# Patient Record
Sex: Female | Born: 1935
Health system: Southern US, Community
[De-identification: ages and names within clinical notes are randomized; demographics above are authoritative.]

## PROBLEM LIST (undated history)

## (undated) DIAGNOSIS — C801 Malignant (primary) neoplasm, unspecified: Secondary | ICD-10-CM

## (undated) DIAGNOSIS — I639 Cerebral infarction, unspecified: Secondary | ICD-10-CM

## (undated) DIAGNOSIS — E785 Hyperlipidemia, unspecified: Secondary | ICD-10-CM

## (undated) HISTORY — DX: Hyperlipidemia, unspecified: E78.5

## (undated) HISTORY — DX: Malignant (primary) neoplasm, unspecified: C80.1

## (undated) HISTORY — DX: Cerebral infarction, unspecified: I63.9

---

## 1963-09-30 HISTORY — PX: APPENDECTOMY: SHX54

## 1963-09-30 HISTORY — PX: TUBAL LIGATION: SHX77

## 1972-09-29 HISTORY — PX: HERNIA REPAIR: SHX51

## 2000-09-29 HISTORY — PX: BREAST LUMPECTOMY: SHX2

## 2000-09-29 HISTORY — PX: BREAST BIOPSY: SHX20

## 2010-03-05 ENCOUNTER — Ambulatory Visit: Payer: Self-pay | Admitting: Ophthalmology

## 2010-03-19 ENCOUNTER — Ambulatory Visit: Payer: Self-pay | Admitting: Ophthalmology

## 2013-05-09 ENCOUNTER — Ambulatory Visit: Payer: Self-pay | Admitting: Family Medicine

## 2013-05-10 ENCOUNTER — Ambulatory Visit: Payer: Self-pay | Admitting: Family Medicine

## 2015-02-28 IMAGING — US US EXTREM LOW VENOUS*R*
1 series · 14 of 24 positions shown · non-contrast
Comparison: none

REASON FOR EXAM: Call Report  [id number]   RLE pain and swelling in leg
COMMENTS:

[Series 1: us extrem low venous*right* · 0.08mm/px · 30 acquisitions, 14 frames shown]
[im 1/30]
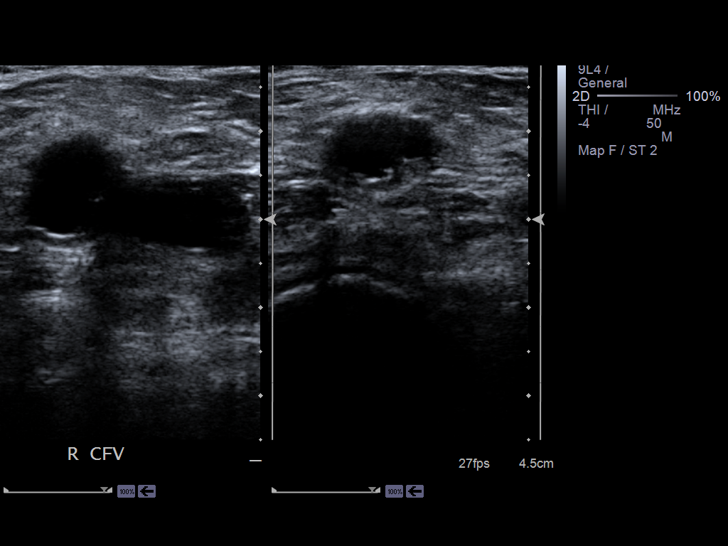
[im 3/30]
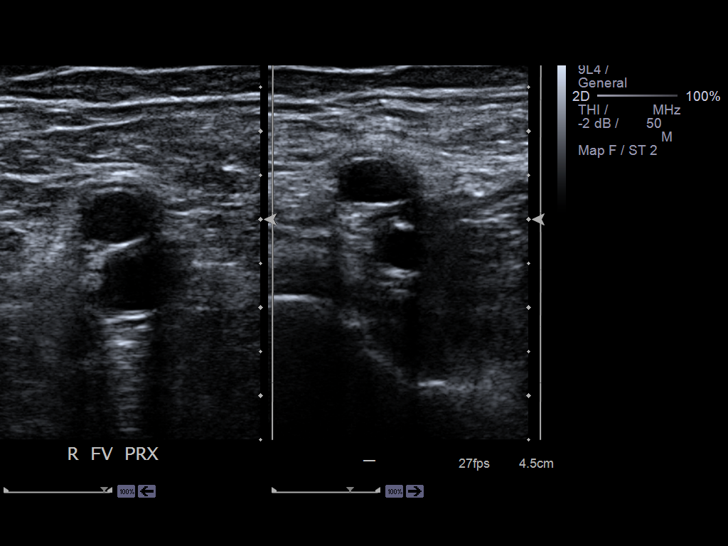
[im 6/30]
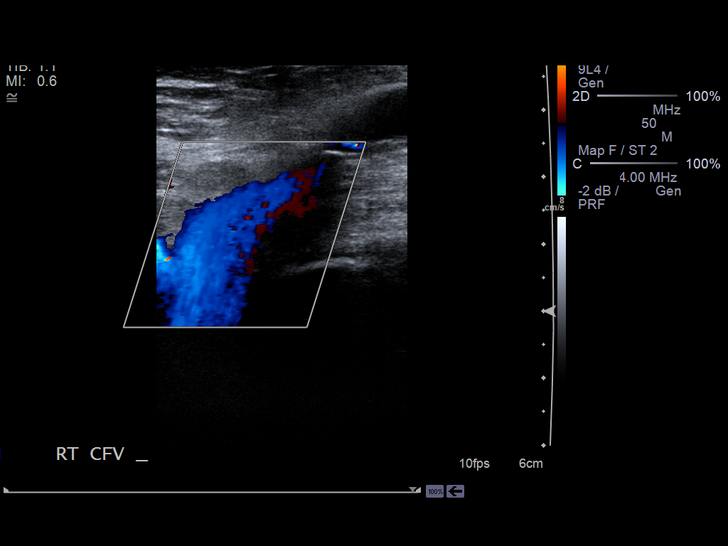
[im 8/30]
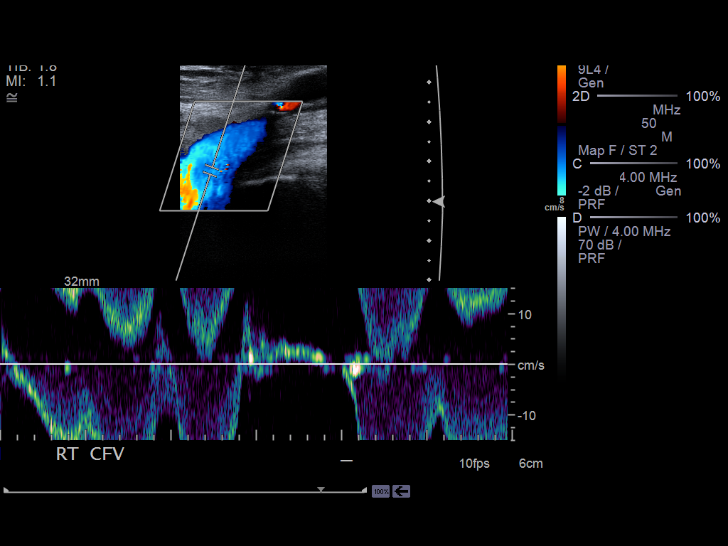
[im 9/30]
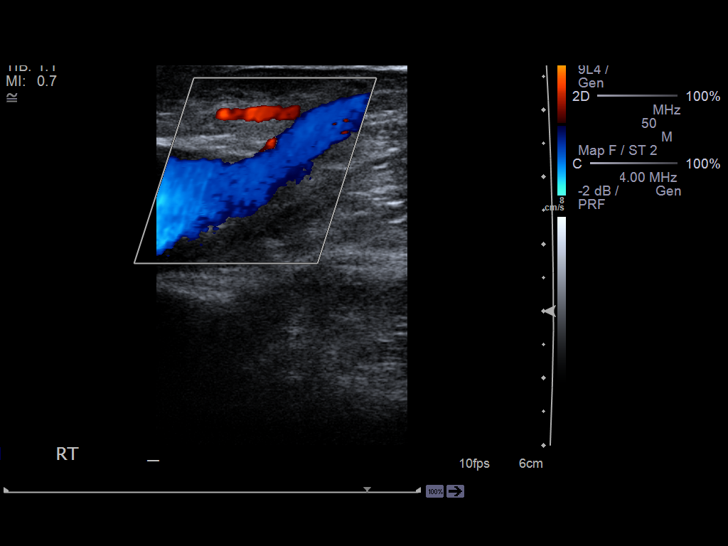
[im 12/30]
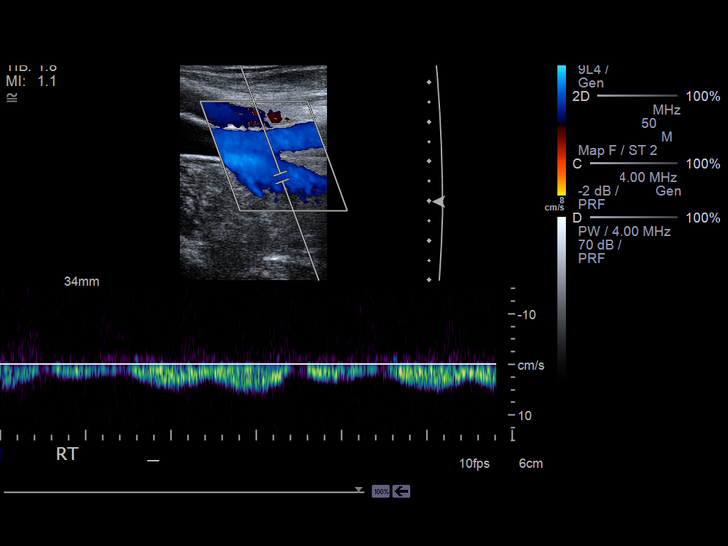
[im 14/30]
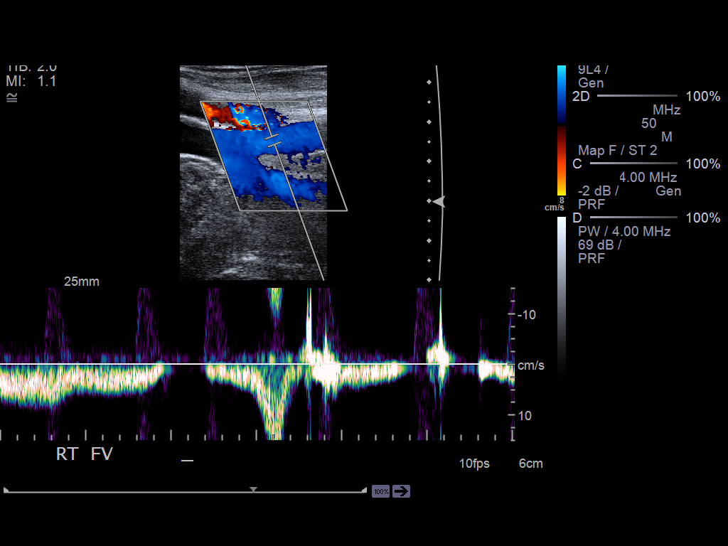
[im 17/30]
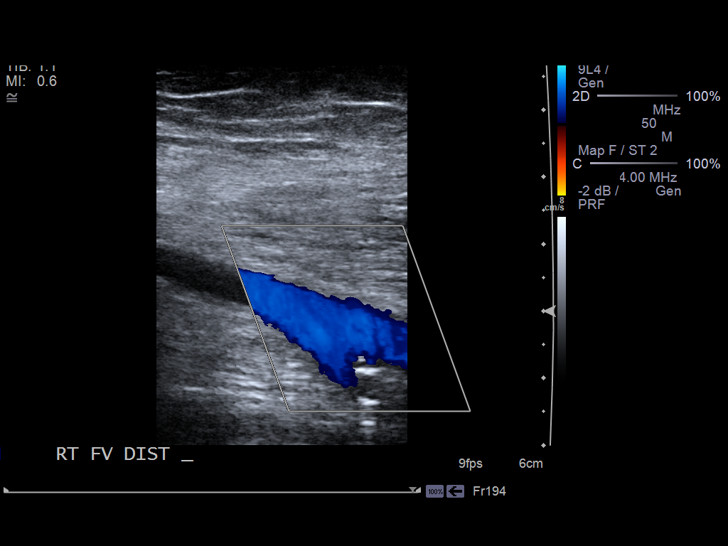
[im 19/30]
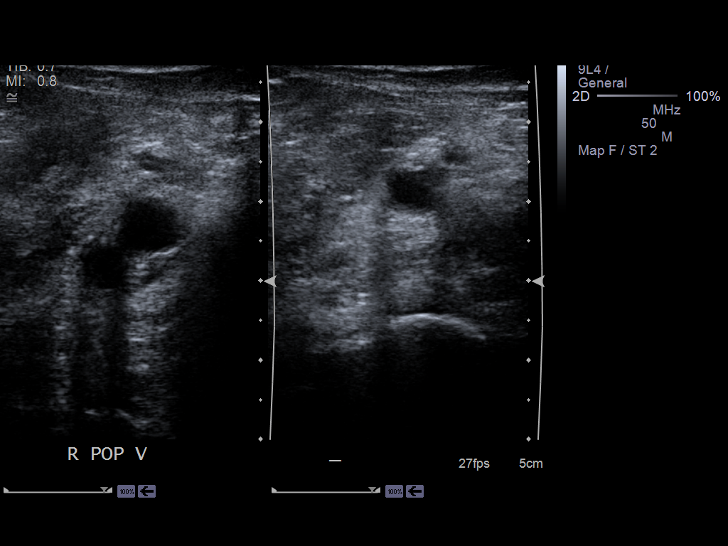
[im 22/30]
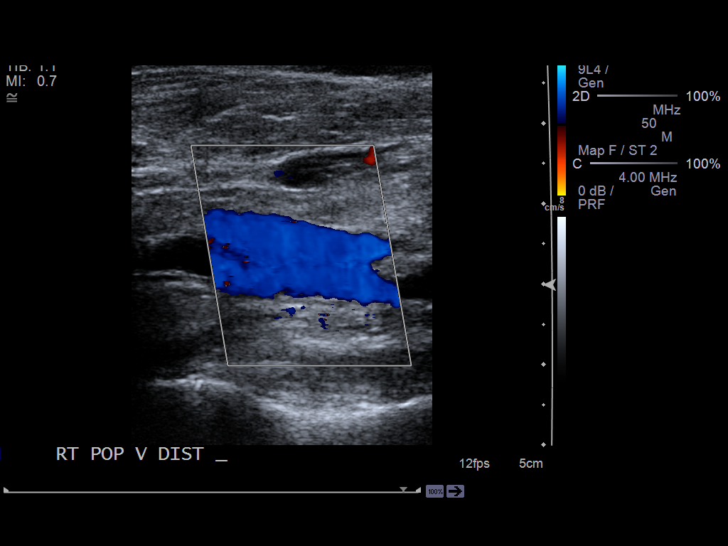
[im 24/30]
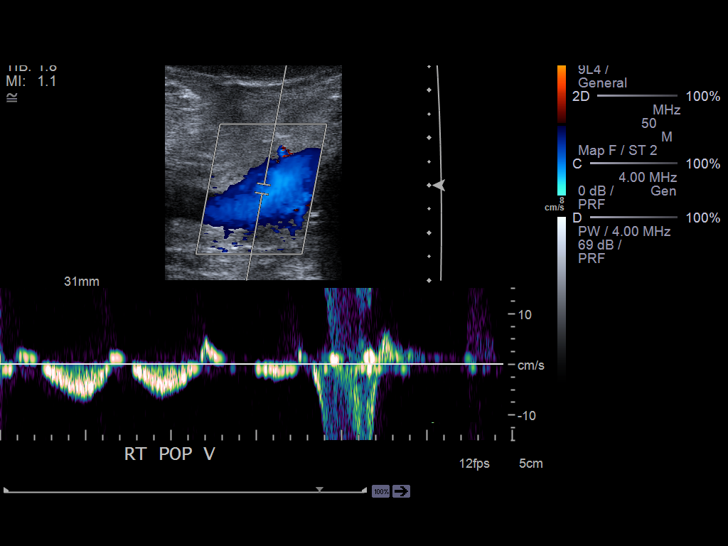
[im 26/30]
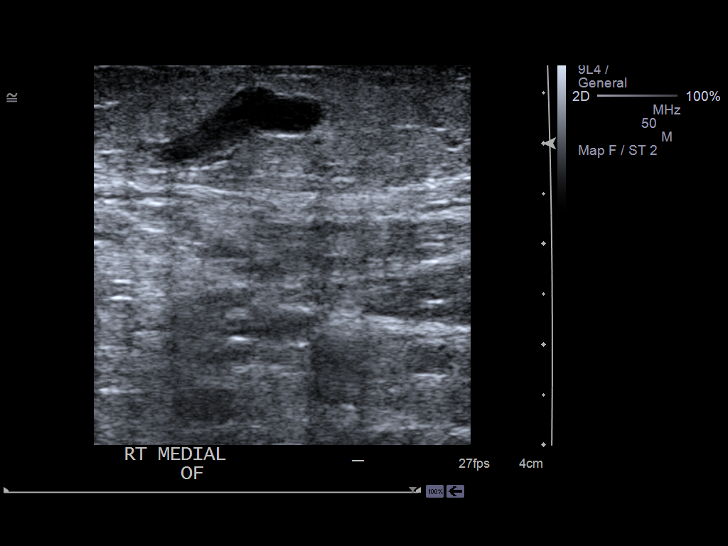
[im 28/30]
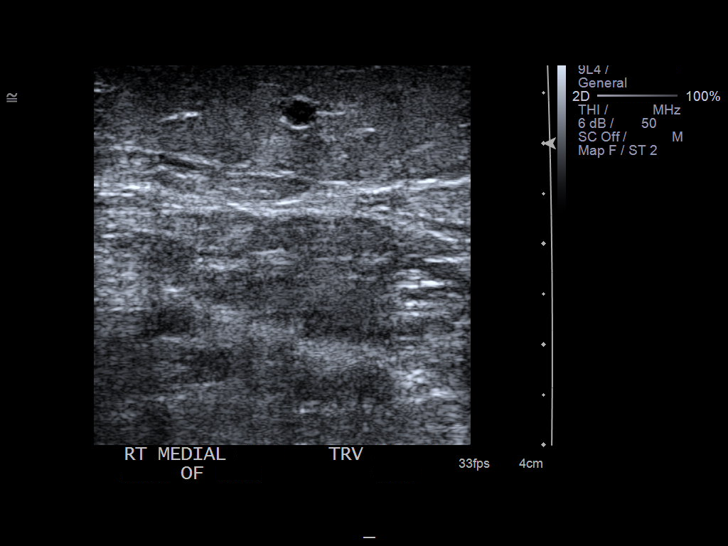
[im 30/30]
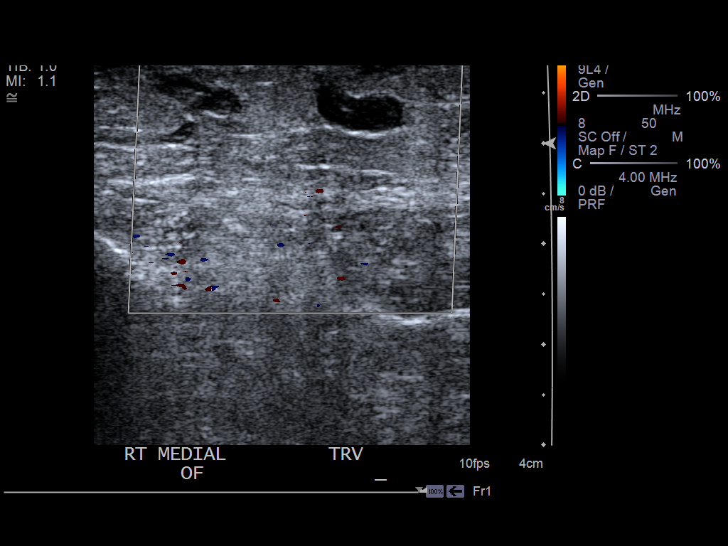

[14 of 24 positions shown; findings below may reference images not displayed]

PROCEDURE:     US  - US DOPPLER LOW EXTR RIGHT  - May 09, 2013  [DATE]

RESULT:     Doppler interrogation of the deep venous system of the right leg
is performed from the common femoral vein through the popliteal vein.
Grayscale images show complete compressibility. The color Doppler and
spectral Doppler appearance is normal. In the area of the medial aspect of
the right calf there is a superficial vein which is thrombosed in the area
of pain.
IMPRESSION: 1. Findings consistent with superficial venous thrombosis in the area of the
patient's symptoms in the medial portion of the catheter. No evidence of
right lower extremity deep vein thrombosis.

[REDACTED]

## 2015-08-06 ENCOUNTER — Ambulatory Visit (INDEPENDENT_AMBULATORY_CARE_PROVIDER_SITE_OTHER): Payer: Medicare PPO | Admitting: Family Medicine

## 2015-08-06 ENCOUNTER — Ambulatory Visit
Admission: RE | Admit: 2015-08-06 | Discharge: 2015-08-06 | Disposition: A | Discharge status: Home / Self Care | Payer: Medicare PPO | Source: Ambulatory Visit | Attending: Family Medicine | Admitting: Family Medicine

## 2015-08-06 ENCOUNTER — Encounter: Payer: Self-pay | Admitting: Family Medicine

## 2015-08-06 VITALS — BP 110/54 | HR 79 | Temp 97.9°F | Resp 16 | Wt 125.4 lb

## 2015-08-06 DIAGNOSIS — H6123 Impacted cerumen, bilateral: Secondary | ICD-10-CM | POA: Diagnosis not present

## 2015-08-06 DIAGNOSIS — M79671 Pain in right foot: Secondary | ICD-10-CM | POA: Diagnosis present

## 2015-08-06 DIAGNOSIS — R55 Syncope and collapse: Secondary | ICD-10-CM | POA: Insufficient documentation

## 2015-08-06 DIAGNOSIS — M79604 Pain in right leg: Secondary | ICD-10-CM

## 2015-08-06 DIAGNOSIS — O871 Deep phlebothrombosis in the puerperium: Secondary | ICD-10-CM | POA: Insufficient documentation

## 2015-08-06 DIAGNOSIS — H9190 Unspecified hearing loss, unspecified ear: Secondary | ICD-10-CM | POA: Insufficient documentation

## 2015-08-06 DIAGNOSIS — I80209 Phlebitis and thrombophlebitis of unspecified deep vessels of unspecified lower extremity: Secondary | ICD-10-CM | POA: Insufficient documentation

## 2015-08-06 DIAGNOSIS — M7989 Other specified soft tissue disorders: Secondary | ICD-10-CM | POA: Diagnosis not present

## 2015-08-06 MED ORDER — NAPROXEN 500 MG PO TABS: 500.0000 mg | ORAL_TABLET | Freq: Two times a day (BID) | ORAL | Status: DC

## 2015-08-06 MED ORDER — NEOMYCIN-POLYMYXIN-HC 3.5-10000-1 OT SOLN: 3.0000 [drp] | Freq: Three times a day (TID) | OTIC | Status: DC

## 2015-08-06 NOTE — Progress Notes (Signed)
Patient ID: Margaret Hall, female   DOB: 1925-02-21, 81 y.o.   MRN: [id number] Name: BERNARD DONAHOO   MRN: [id number]    DOB: May 31, 1925   Date:08/06/2015       Progress Note  Subjective  Chief Complaint  Chief Complaint  Patient presents with  . Foot Pain    right foot  . Toe Pain    right great toe    Foot Pain This is a new problem. The current episode started in the past 7 days (was working in the yard and thought she had stepped on a rock). The problem occurs constantly. The problem has been gradually worsening. Pertinent negatives include no chills or fever. Associated symptoms comments: Notice some redness and tenderness to touch.. The symptoms are aggravated by standing, walking and twisting. She has tried NSAIDs and heat for the symptoms. The treatment provided mild (gets better with Ibuprofen but returns when it wears off) relief.   Patient Active Problem List   Diagnosis Date Noted  . Difficulty hearing 08/06/2015  . Deep vein thrombosis (DVT), postpartum 08/06/2015  . Thrombophlebitis of deep veins of lower extremity ([place]) 08/06/2015  . Syncope and collapse 08/06/2015  . H/O malignant neoplasm of breast 12/03/2009   Past Surgical History  Procedure Laterality Date  . Appendectomy  1965  . Tubal ligation  1965  . Hernia repair  1974  . Breast biopsy Right 2002  . Breast lumpectomy Right 2002   Family History  Problem Relation Age of Onset  . Heart disease Sister   . Melanoma Brother   . Heart attack Brother    Social History  Substance Use Topics  . Smoking status: Never Smoker   . Smokeless tobacco: Never Used  . Alcohol Use: No    Current outpatient prescriptions:  .  BISMUTH SUBSALICYLATE PO, , Disp: , Rfl:  .  ibuprofen (ADVIL,MOTRIN) 200 MG tablet, IBUPROFEN, 200MG  (Oral Tablet)  PRN for headaches for 0 days  Quantity: 0.00;  Refills: 0   Ordered :18-Feb-2011  Doy Hutching ;  Started 03-Dec-2009 Active, Disp: , Rfl:  .  Multiple  Vitamins-Minerals (EYE VITAMINS PO), Take by mouth., Disp: , Rfl:   Allergies  Allergen Reactions  . Sulfa Antibiotics Nausea Only   Review of Systems  Constitutional: Negative.  Negative for fever and chills.  HENT: Negative.   Eyes: Negative.   Respiratory: Negative.   Cardiovascular: Negative.   Gastrointestinal: Negative.   Genitourinary: Negative.   Musculoskeletal: Positive for joint pain.  Skin: Negative.   Neurological: Negative.   Endo/Heme/Allergies: Negative.   Psychiatric/Behavioral: Negative.    Objective  Filed Vitals:   08/06/15 1344  BP: 110/54  Pulse: 79  Temp: 97.9 F (36.6 C)  TempSrc: Oral  Resp: 16  Weight: 125 lb 6.4 oz (56.881 kg)  SpO2: 95%   Physical Exam  Constitutional: She is oriented to person, place, and time and well-developed, well-nourished, and in no distress.  HENT:  Head: Normocephalic.  Bilateral cerumen impaction with diminished hearing.  Cardiovascular: Normal rate and regular rhythm.   Pulmonary/Chest: Effort normal and breath sounds normal.  Musculoskeletal: She exhibits tenderness.  Nodular joints in fingers of both hands. Red and very tender plantar surface of the right 1st MTP joint.  Neurological: She is alert and oriented to person, place, and time.  Psychiatric: Affect and judgment normal.   Assessment & Plan  1. Pain of right lower extremity Onset over the past week after moving some cinder blocks in  her yard. Pain in the right 1st MTP joint with redness. Suspect gout. Will get labs and x-ray evaluation. Treat with Naproxen and elevate when possible. Recheck pending lab and x-ray reports. - CBC with Differential/Platelet - COMPLETE METABOLIC PANEL WITH GFR - Uric acid - DG Foot Complete Right - Sedimentation rate - naproxen (NAPROSYN) 500 MG tablet; Take 1 tablet (500 mg total) by mouth 2 (two) times daily with a meal.  Dispense: 60 tablet; Refill: 0  2. Cerumen impaction, bilateral Both ears with wax impactions  and diminished hearing. Unable to irrigate canals clear of was. Will use Cortisporin Otic drops TID to help loosen cerumen and recheck in 4 days.

## 2015-08-06 NOTE — Patient Instructions (Signed)
Cerumen Impaction The structures of the external ear canal secrete a waxy substance known as cerumen. Excess cerumen can build up in the ear canal, causing a condition known as cerumen impaction. Cerumen impaction can cause ear pain and disrupt the function of the ear. The rate of cerumen production differs for each individual. In certain individuals, the configuration of the ear canal may decrease his or her ability to naturally remove cerumen. CAUSES Cerumen impaction is caused by excessive cerumen production or buildup. RISK FACTORS  Frequent use of swabs to clean ears.  Having narrow ear canals.  Having eczema.  Being dehydrated. SIGNS AND SYMPTOMS  Diminished hearing.  Ear drainage.  Ear pain.  Ear itch. TREATMENT Treatment may involve:  Over-the-counter or prescription ear drops to soften the cerumen.  Removal of cerumen by a health care provider. This may be done with:  Irrigation with warm water. This is the most common method of removal.  Ear curettes and other instruments.  Surgery. This may be done in severe cases. HOME CARE INSTRUCTIONS  Take medicines only as directed by your health care provider.  Do not insert objects into the ear with the intent of cleaning the ear. PREVENTION  Do not insert objects into the ear, even with the intent of cleaning the ear. Removing cerumen as a part of normal hygiene is not necessary, and the use of swabs in the ear canal is not recommended.  Drink enough water to keep your urine clear or pale yellow.  Control your eczema if you have it. SEEK MEDICAL CARE IF:  You develop ear pain.  You develop bleeding from the ear.  The cerumen does not clear after you use ear drops as directed.   This information is not intended to replace advice given to you by your health care provider. Make sure you discuss any questions you have with your health care provider.   Document Released: 10/23/2004 Document Revised: 10/06/2014  Document Reviewed: 05/02/2015 Elsevier Interactive Patient Education 2016 Elsevier Inc.  

## 2015-08-07 ENCOUNTER — Telehealth: Payer: Self-pay

## 2015-08-07 LAB — CBC WITH DIFFERENTIAL/PLATELET
BASOS: 0 %
Basophils Absolute: 0 10*3/uL (ref 0.0–0.2)
EOS (ABSOLUTE): 0.1 10*3/uL (ref 0.0–0.4)
EOS: 2 %
HEMATOCRIT: 39.3 % (ref 34.0–46.6)
Hemoglobin: 13 g/dL (ref 11.1–15.9)
IMMATURE GRANS (ABS): 0 10*3/uL (ref 0.0–0.1)
IMMATURE GRANULOCYTES: 0 %
LYMPHS: 27 %
Lymphocytes Absolute: 2.4 10*3/uL (ref 0.7–3.1)
MCH: 30.8 pg (ref 26.6–33.0)
MCHC: 33.1 g/dL (ref 31.5–35.7)
MCV: 93 fL (ref 79–97)
MONOCYTES: 9 %
Monocytes Absolute: 0.8 10*3/uL (ref 0.1–0.9)
NEUTROS ABS: 5.5 10*3/uL (ref 1.4–7.0)
NEUTROS PCT: 62 %
PLATELETS: 164 10*3/uL (ref 150–379)
RBC: 4.22 x10E6/uL (ref 3.77–5.28)
RDW: 13.8 % (ref 12.3–15.4)
WBC: 8.8 10*3/uL (ref 3.4–10.8)

## 2015-08-07 LAB — SEDIMENTATION RATE: Sed Rate: 2 mm/hr (ref 0–40)

## 2015-08-07 LAB — COMPREHENSIVE METABOLIC PANEL
ALBUMIN: 4.2 g/dL (ref 3.5–4.7)
ALT: 9 IU/L (ref 0–32)
AST: 19 IU/L (ref 0–40)
Albumin/Globulin Ratio: 1.9 (ref 1.1–2.5)
Alkaline Phosphatase: 71 IU/L (ref 39–117)
BUN / CREAT RATIO: 25 (ref 11–26)
BUN: 20 mg/dL (ref 8–27)
Bilirubin Total: 0.3 mg/dL (ref 0.0–1.2)
CALCIUM: 9.4 mg/dL (ref 8.7–10.3)
CO2: 25 mmol/L (ref 18–29)
Chloride: 103 mmol/L (ref 97–106)
Creatinine, Ser: 0.81 mg/dL (ref 0.57–1.00)
GFR, EST AFRICAN AMERICAN: 75 mL/min/{1.73_m2} (ref >59)
GFR, EST NON AFRICAN AMERICAN: 65 mL/min/{1.73_m2} (ref >59)
Globulin, Total: 2.2 g/dL (ref 1.5–4.5)
Glucose: 127 mg/dL — ABNORMAL HIGH (ref 65–99)
POTASSIUM: 4.5 mmol/L (ref 3.5–5.2)
Sodium: 145 mmol/L — ABNORMAL HIGH (ref 136–144)
TOTAL PROTEIN: 6.4 g/dL (ref 6.0–8.5)

## 2015-08-07 LAB — URIC ACID: Uric Acid: 5 mg/dL (ref 2.5–7.1)

## 2015-08-07 NOTE — Telephone Encounter (Signed)
Patient advised as directed below. Patient verbalized understanding.  

## 2015-08-07 NOTE — Telephone Encounter (Signed)
-----   Message from [place], [place] sent at 08/07/2015 12:06 PM EST ----- No sign of infection or gout in foot. X-ray shows some arthritis in toes. Should use Naproxen as prescribed and recheck ears as planned in 3 days.

## 2015-08-07 NOTE — Telephone Encounter (Signed)
Patient advised as directed below. Patient verbalized understanding and agrees with plan of care. Patient scheduled for a follow up appointment on 08/07/2015.

## 2015-08-07 NOTE — Telephone Encounter (Signed)
-----   Message from [place], [place] sent at 08/06/2015  5:25 PM EST ----- Some arthritis in toes and an unusual calcification along the plantar fascia of the bottom of the great toe joint. No sign of fracture. Awaiting final report of labs.

## 2015-08-10 ENCOUNTER — Encounter: Payer: Self-pay | Admitting: Family Medicine

## 2015-08-10 ENCOUNTER — Ambulatory Visit (INDEPENDENT_AMBULATORY_CARE_PROVIDER_SITE_OTHER): Payer: Medicare PPO | Admitting: Family Medicine

## 2015-08-10 VITALS — BP 124/60 | HR 80 | Temp 98.5°F | Resp 16 | Wt 127.0 lb

## 2015-08-10 DIAGNOSIS — H6123 Impacted cerumen, bilateral: Secondary | ICD-10-CM

## 2015-08-10 DIAGNOSIS — H6120 Impacted cerumen, unspecified ear: Secondary | ICD-10-CM | POA: Diagnosis not present

## 2015-08-10 DIAGNOSIS — H9202 Otalgia, left ear: Secondary | ICD-10-CM | POA: Diagnosis not present

## 2015-08-10 DIAGNOSIS — H918X9 Other specified hearing loss, unspecified ear: Secondary | ICD-10-CM

## 2015-08-10 NOTE — Progress Notes (Signed)
Patient ID: Margaret Hall, female   DOB: Nov 06, 1924, 81 y.o.   MRN: [id number]   Chief Complaint  Patient presents with  . Cerumen Impaction    Subjective:  HPI  Patient is here to follow up on decreased hearing and cerumen impaction. She was last seen 4 days ago and was advised to use Cortisporin drops for her ears. Patient has been using the drops. Patient states that hearing is not better in her left ear and that after the irrigation on her last office visit she developed a shooting pain and the shooting sensation resolved but ear still feels sore.  Prior to Admission medications   Medication Sig Start Date End Date Taking? Authorizing Provider  BISMUTH SUBSALICYLATE PO  [id number]  Yes Historical Provider, MD  Multiple Vitamins-Minerals (EYE VITAMINS PO) Take by mouth.   Yes Historical Provider, MD  naproxen (NAPROSYN) 500 MG tablet Take 1 tablet (500 mg total) by mouth 2 (two) times daily with a meal. 08/06/15  Yes Dennis E Chrismon, PA  neomycin-polymyxin-hydrocortisone (CORTISPORIN) otic solution Place 3 drops into both ears 3 (three) times daily. 08/06/15  Yes Margo Common, PA    Patient Active Problem List   Diagnosis Date Noted  . Difficulty hearing 08/06/2015  . Deep vein thrombosis (DVT), postpartum 08/06/2015  . Thrombophlebitis of deep veins of lower extremity ([place]) 08/06/2015  . Syncope and collapse 08/06/2015  . H/O malignant neoplasm of breast 12/03/2009    No past medical history on file.  Social History   Social History  . Marital Status: Widowed    Spouse Name: N/A  . Number of Children: N/A  . Years of Education: N/A   Occupational History  . Not on file.   Social History Main Topics  . Smoking status: Never Smoker   . Smokeless tobacco: Never Used  . Alcohol Use: No  . Drug Use: No  . Sexual Activity: No   Other Topics Concern  . Not on file   Social History Narrative    Allergies  Allergen Reactions  . Sulfa Antibiotics Nausea Only     Review of Systems  Constitutional: Negative.   HENT: Positive for ear pain.        Decreased hearing     There is no immunization history on file for this patient. Objective:  BP 124/60 mmHg  Pulse 80  Temp(Src) 98.5 F (36.9 C)  Resp 16  Wt 127 lb (57.607 kg)  Physical Exam  Constitutional: She is well-developed, well-nourished, and in no distress.  HENT:  Head: Normocephalic.  Left ear slightly tender to press on tragus. Both ear canals occluded with wax. No redness or drainage. Hearing very much diminished.  Eyes: Conjunctivae and EOM are normal.    Lab Results  Component Value Date   WBC 8.8 08/06/2015   HCT 39.3 08/06/2015   GLUCOSE 127* 08/06/2015    CMP     Component Value Date/Time   NA 145* 08/06/2015 1455   K 4.5 08/06/2015 1455   CL 103 08/06/2015 1455   CO2 25 08/06/2015 1455   GLUCOSE 127* 08/06/2015 1455   BUN 20 08/06/2015 1455   CREATININE 0.81 08/06/2015 1455   CALCIUM 9.4 08/06/2015 1455   PROT 6.4 08/06/2015 1455   ALBUMIN 4.2 08/06/2015 1455   AST 19 08/06/2015 1455   ALT 9 08/06/2015 1455   ALKPHOS 71 08/06/2015 1455   BILITOT 0.3 08/06/2015 1455   GFRNONAA 65 08/06/2015 1455   GFRAA 75 08/06/2015 1455  Assessment and Plan :   1. Bilateral hearing loss due to cerumen impaction Some further decrease in hearing since attempting irrigation 3 days ago. Still using Cortisporin Otic drops for discomfort and to help soften wax. With worsening hearing and discomfort, will refer to ENT. - Ambulatory referral to ENT  2. Left ear pain Unable to see TM due to bilateral cerumen impactions. With pain in left ear, will refer to ENT to rule out perforation. - Ambulatory referral to ENT   [facility] 08/10/2015 10:44 AM

## 2015-08-17 ENCOUNTER — Telehealth: Payer: Self-pay | Admitting: Family Medicine

## 2015-08-17 NOTE — Telephone Encounter (Signed)
Pt stated that she finished her neomycin-polymyxin-hydrocortisone (CORTISPORIN) otic solution today and wasn't sure if she needed to get them refilled. Pt stated she is going to the ENT on 08/28/15 and wasn't sure if she was supposed to use the drops until her visit. Pt stated if she needs to continue she would like the refill sent to [facility] in [place]. Thanks TNP

## 2015-08-18 NOTE — Telephone Encounter (Signed)
Shouldn't need more ear drops unless pain or drainage from the ears is occurring.

## 2015-08-18 NOTE — Telephone Encounter (Signed)
Please review-aa 

## 2015-08-20 NOTE — Telephone Encounter (Signed)
Patient advised as directed below. Patient verbalized understanding.  

## 2015-09-14 ENCOUNTER — Telehealth: Payer: Self-pay | Admitting: Family Medicine

## 2015-09-14 NOTE — Telephone Encounter (Signed)
Pt called saying she is getting an upper resp infection and wants to know if you will call her in a rx of amoxicillin.   She uses [facility] in [place]   Her call back is  [phone]  Thanks Con Memos

## 2015-09-14 NOTE — Telephone Encounter (Signed)
Contacted patient. Patient reports coughing and sputum production X 1 week. Patient denies any other symptoms. Please review.

## 2015-09-14 NOTE — Telephone Encounter (Signed)
Having some cough and congestion without fever over the past week. Only taking some liquid ibuprofen. Recommend Robitussin Cough and Cold or Mucinex-DM. Recheck in the office if no better over the weekend.

## 2016-01-18 ENCOUNTER — Ambulatory Visit (INDEPENDENT_AMBULATORY_CARE_PROVIDER_SITE_OTHER): Payer: Medicare PPO | Admitting: Family Medicine

## 2016-01-18 ENCOUNTER — Encounter: Payer: Self-pay | Admitting: Family Medicine

## 2016-01-18 VITALS — BP 102/54 | HR 87 | Temp 98.8°F | Resp 14 | Wt 124.2 lb

## 2016-01-18 DIAGNOSIS — J209 Acute bronchitis, unspecified: Secondary | ICD-10-CM

## 2016-01-18 DIAGNOSIS — J00 Acute nasopharyngitis [common cold]: Secondary | ICD-10-CM | POA: Diagnosis not present

## 2016-01-18 DIAGNOSIS — J069 Acute upper respiratory infection, unspecified: Secondary | ICD-10-CM

## 2016-01-18 MED ORDER — AZITHROMYCIN 250 MG PO TABS: ORAL_TABLET | ORAL | Status: DC

## 2016-01-18 NOTE — Patient Instructions (Signed)
Upper Respiratory Infection, Adult Most upper respiratory infections (URIs) are a viral infection of the air passages leading to the lungs. A URI affects the nose, throat, and upper air passages. The most common type of URI is nasopharyngitis and is typically referred to as "the common cold." URIs run their course and usually go away on their own. Most of the time, a URI does not require medical attention, but sometimes a bacterial infection in the upper airways can follow a viral infection. This is called a secondary infection. Sinus and middle ear infections are common types of secondary upper respiratory infections. Bacterial pneumonia can also complicate a URI. A URI can worsen asthma and chronic obstructive pulmonary disease (COPD). Sometimes, these complications can require emergency medical care and may be life threatening.  CAUSES Almost all URIs are caused by viruses. A virus is a type of germ and can spread from one person to another.  RISKS FACTORS You may be at risk for a URI if:   You smoke.   You have chronic heart or lung disease.  You have a weakened defense (immune) system.   You are very young or very old.   You have nasal allergies or asthma.  You work in crowded or poorly ventilated areas.  You work in health care facilities or schools. SIGNS AND SYMPTOMS  Symptoms typically develop 2-3 days after you come in contact with a cold virus. Most viral URIs last 7-10 days. However, viral URIs from the influenza virus (flu virus) can last 14-18 days and are typically more severe. Symptoms may include:   Runny or stuffy (congested) nose.   Sneezing.   Cough.   Sore throat.   Headache.   Fatigue.   Fever.   Loss of appetite.   Pain in your forehead, behind your eyes, and over your cheekbones (sinus pain).  Muscle aches.  DIAGNOSIS  Your health care provider may diagnose a URI by:  Physical exam.  Tests to check that your symptoms are not due to  another condition such as:  Strep throat.  Sinusitis.  Pneumonia.  Asthma. TREATMENT  A URI goes away on its own with time. It cannot be cured with medicines, but medicines may be prescribed or recommended to relieve symptoms. Medicines may help:  Reduce your fever.  Reduce your cough.  Relieve nasal congestion. HOME CARE INSTRUCTIONS   Take medicines only as directed by your health care provider.   Gargle warm saltwater or take cough drops to comfort your throat as directed by your health care provider.  Use a warm mist humidifier or inhale steam from a shower to increase air moisture. This may make it easier to breathe.  Drink enough fluid to keep your urine clear or pale yellow.   Eat soups and other clear broths and maintain good nutrition.   Rest as needed.   Return to work when your temperature has returned to normal or as your health care provider advises. You may need to stay home longer to avoid infecting others. You can also use a face mask and careful hand washing to prevent spread of the virus.  Increase the usage of your inhaler if you have asthma.   Do not use any tobacco products, including cigarettes, chewing tobacco, or electronic cigarettes. If you need help quitting, ask your health care provider. PREVENTION  The best way to protect yourself from getting a cold is to practice good hygiene.   Avoid oral or hand contact with people with cold   symptoms.   Wash your hands often if contact occurs.  There is no clear evidence that vitamin C, vitamin E, echinacea, or exercise reduces the chance of developing a cold. However, it is always recommended to get plenty of rest, exercise, and practice good nutrition.  SEEK MEDICAL CARE IF:   You are getting worse rather than better.   Your symptoms are not controlled by medicine.   You have chills.  You have worsening shortness of breath.  You have brown or red mucus.  You have yellow or brown nasal  discharge.  You have pain in your face, especially when you bend forward.  You have a fever.  You have swollen neck glands.  You have pain while swallowing.  You have white areas in the back of your throat. SEEK IMMEDIATE MEDICAL CARE IF:   You have severe or persistent:  Headache.  Ear pain.  Sinus pain.  Chest pain.  You have chronic lung disease and any of the following:  Wheezing.  Prolonged cough.  Coughing up blood.  A change in your usual mucus.  You have a stiff neck.  You have changes in your:  Vision.  Hearing.  Thinking.  Mood. MAKE SURE YOU:   Understand these instructions.  Will watch your condition.  Will get help right away if you are not doing well or get worse.   This information is not intended to replace advice given to you by your health care provider. Make sure you discuss any questions you have with your health care provider.   Document Released: 03/11/2001 Document Revised: 01/30/2015 Document Reviewed: 12/21/2013 Elsevier Interactive Patient Education 2016 Elsevier Inc.  

## 2016-01-18 NOTE — Progress Notes (Signed)
Patient ID: Margaret Hall, female   DOB: 04-13-1925, 82 y.o.   MRN: [id number]   Patient: Margaret Hall Female    DOB: January 25, 1925   82 y.o.   MRN: [id number] Visit Date: 01/18/2016  Today's Provider: [name], PA   Chief Complaint  Patient presents with  . URI   Subjective:    URI  This is a new problem. The current episode started in the past 7 days (Monday). The problem has been unchanged. There has been no fever. Associated symptoms include congestion, coughing, headaches, sneezing and a sore throat (scratchy). Pertinent negatives include no rhinorrhea. She has tried antihistamine (cough suppressant ) for the symptoms. The treatment provided no relief.    History reviewed. No pertinent past medical history. Patient Active Problem List   Diagnosis Date Noted  . Difficulty hearing 08/06/2015  . Deep vein thrombosis (DVT), postpartum 08/06/2015  . Thrombophlebitis of deep veins of lower extremity ([place]) 08/06/2015  . Syncope and collapse 08/06/2015  . H/O malignant neoplasm of breast 12/03/2009   Past Surgical History  Procedure Laterality Date  . Appendectomy  1965  . Tubal ligation  1965  . Hernia repair  1974  . Breast biopsy Right 2002  . Breast lumpectomy Right 2002   Family History  Problem Relation Age of Onset  . Heart disease Sister   . Melanoma Brother   . Heart attack Brother    Previous Medications   BISMUTH SUBSALICYLATE PO       MULTIPLE VITAMINS-MINERALS (EYE VITAMINS PO)    Take by mouth.   NAPROXEN (NAPROSYN) 500 MG TABLET    Take 1 tablet (500 mg total) by mouth 2 (two) times daily with a meal.   Allergies  Allergen Reactions  . Sulfa Antibiotics Nausea Only    Review of Systems  Constitutional: Negative.   HENT: Positive for congestion, sneezing and sore throat (scratchy). Negative for rhinorrhea.   Eyes: Negative.   Respiratory: Positive for cough.   Cardiovascular: Negative.   Gastrointestinal: Negative.   Endocrine: Negative.     Genitourinary: Negative.   Musculoskeletal: Negative.   Skin: Negative.   Allergic/Immunologic: Negative.   Neurological: Positive for headaches.  Hematological: Negative.   Psychiatric/Behavioral: Negative.     Social History  Substance Use Topics  . Smoking status: Never Smoker   . Smokeless tobacco: Never Used  . Alcohol Use: No   Objective:   BP 102/54 mmHg  Pulse 87  Temp(Src) 98.8 F (37.1 C) (Oral)  Resp 14  Wt 124 lb 3.2 oz (56.337 kg)  SpO2 95%  Physical Exam  Constitutional: She is oriented to person, place, and time. She appears well-developed and well-nourished.  HENT:  Head: Normocephalic.  Right Ear: External ear normal.  Left Ear: External ear normal.  Injected posterior pharynx. No exudates. Using hearing aid in the right ear. TM's with good reflex.  Eyes: Conjunctivae and EOM are normal.  Neck: Neck supple.  Cardiovascular: Normal rate, regular rhythm and normal heart sounds.   Pulmonary/Chest: Effort normal. She has no wheezes.  Coarse breath sounds with slight rhonchus.  Abdominal: Bowel sounds are normal.  Neurological: She is alert and oriented to person, place, and time.      Assessment & Plan:     1. Subacute bronchitis Onset with cough and some scratchy throat symptoms over the past 5 days. No fever but some rhonchi heard today. Will treat with antibiotic and may use Mucinex-DM. Increase fluid intake and recheck in 1 week  if needed. - azithromycin (ZITHROMAX) 250 MG tablet; Two tablets by mouth today then one daily for 4 days.  Dispense: 6 tablet; Refill: 0  2. Upper respiratory infection Having post nasal drip and scratchy throat. No fever but throat reddened without exudates. May gargle with warm saltwater and use antihistamine for any rhinorrhea or sneezing. Given antibiotic for early bronchitis. - azithromycin (ZITHROMAX) 250 MG tablet; Two tablets by mouth today then one daily for 4 days.  Dispense: 6 tablet; Refill: 0

## 2016-07-29 ENCOUNTER — Ambulatory Visit (INDEPENDENT_AMBULATORY_CARE_PROVIDER_SITE_OTHER): Payer: Medicare PPO | Admitting: Family Medicine

## 2016-07-29 ENCOUNTER — Encounter: Payer: Self-pay | Admitting: Family Medicine

## 2016-07-29 VITALS — BP 108/52 | Temp 98.2°F | Ht 61.0 in | Wt 125.4 lb

## 2016-07-29 DIAGNOSIS — Z23 Encounter for immunization: Secondary | ICD-10-CM

## 2016-07-29 DIAGNOSIS — H3530 Unspecified macular degeneration: Secondary | ICD-10-CM | POA: Diagnosis not present

## 2016-07-29 DIAGNOSIS — Z0000 Encounter for general adult medical examination without abnormal findings: Secondary | ICD-10-CM

## 2016-07-29 NOTE — Progress Notes (Signed)
Patient: Margaret Hall, Female    DOB: [birth year], 82 y.o.   MRN: [id number] Visit Date: 07/29/2016  Today's Provider: [name], PA   Chief Complaint  Patient presents with  . Medicare Wellness   Subjective:    Annual wellness visit LESSLI DEBATES is a 82 y.o. female who presents today for her Subsequent Annual Wellness Visit. She feels well. She reports exercising doing yard work and house work. She reports she is sleeping well.  -----------------------------------------------------------   Review of Systems  Constitutional: Negative.   HENT: Positive for hearing loss, rhinorrhea and sneezing.   Eyes: Positive for itching.  Respiratory: Negative.   Cardiovascular: Negative.   Gastrointestinal: Negative.   Endocrine: Negative.   Genitourinary: Negative.   Musculoskeletal: Positive for arthralgias.  Skin: Negative.   Allergic/Immunologic: Negative.   Neurological: Positive for headaches.  Hematological: Bruises/bleeds easily.  Psychiatric/Behavioral: Negative.     Social History   Social History  . Marital status: Widowed    Spouse name: N/A  . Number of children: N/A  . Years of education: N/A   Occupational History  . Not on file.   Social History Main Topics  . Smoking status: Never Smoker  . Smokeless tobacco: Never Used  . Alcohol use No  . Drug use: No  . Sexual activity: No   Other Topics Concern  . Not on file   Social History Narrative  . No narrative on file    Patient Active Problem List   Diagnosis Date Noted  . Difficulty hearing 08/06/2015  . Deep vein thrombosis (DVT), postpartum 08/06/2015  . Thrombophlebitis of deep veins of lower extremity ([place]) 08/06/2015  . Syncope and collapse 08/06/2015  . H/O malignant neoplasm of breast 12/03/2009    Past Surgical History:  Procedure Laterality Date  . APPENDECTOMY  1965  . BREAST BIOPSY Right 2002  . BREAST LUMPECTOMY Right 2002  . HERNIA REPAIR  1974  . [place]    Her family history includes Heart attack in her brother; Heart disease in her sister; Melanoma in her brother.    Previous Medications   BISMUTH SUBSALICYLATE PO       IBUPROFEN (ADVIL,MOTRIN) 200 MG TABLET    Take 200 mg by mouth as needed.   MULTIPLE VITAMINS-MINERALS (EYE VITAMINS PO)    Take by mouth.    Patient Care Team: Margo Common, PA as PCP - General (Family Medicine)     Objective:   Vitals: BP (!) 108/52 (BP Location: Right Arm, Patient Position: Sitting, Cuff Size: Normal)   Temp 98.2 F (36.8 C) (Oral)   Ht 5\' 1"  (1.549 m)   Wt 125 lb 6.4 oz (56.9 kg)   BMI 23.69 kg/m   Physical Exam  Constitutional: She is oriented to person, place, and time. She appears well-developed and well-nourished.  HENT:  Head: Normocephalic and atraumatic.  Right Ear: External ear normal.  Left Ear: External ear normal.  Nose: Nose normal.  Mouth/Throat: Oropharynx is clear and moist.  Eyes: Conjunctivae and EOM are normal. Pupils are equal, round, and reactive to light. Right eye exhibits no discharge.  Neck: Normal range of motion. Neck supple. No tracheal deviation present. No thyromegaly present.  Cardiovascular: Normal rate, regular rhythm, normal heart sounds and intact distal pulses.   No murmur heard. Pulmonary/Chest: Effort normal and breath sounds normal. No respiratory distress. She has no wheezes. She has no rales. She exhibits no tenderness.  Abdominal: Soft. She exhibits no distension  and no mass. There is no tenderness. There is no rebound and no guarding.  Musculoskeletal: Normal range of motion. She exhibits deformity. She exhibits no edema or tenderness.  Enlarged joints of fingers. Some stiffness in the mornings but fair grip strength.  Lymphadenopathy:    She has no cervical adenopathy.  Neurological: She is alert and oriented to person, place, and time. She has normal reflexes. No cranial nerve deficit. She exhibits normal muscle tone. Coordination  normal.  Skin: Skin is warm and dry. No rash noted. No erythema.  Psychiatric: She has a normal mood and affect. Her behavior is normal. Judgment and thought content normal.    Activities of Daily Living In your present state of health, do you have any difficulty performing the following activities: 07/29/2016  Hearing? Y  Vision? Y  Difficulty concentrating or making decisions? N  Walking or climbing stairs? N  Dressing or bathing? N  Doing errands, shopping? N  Some recent data might be hidden    Fall Risk Assessment Fall Risk  07/29/2016  Falls in the past year? No   Depression Screen PHQ 2/9 Scores 07/29/2016  PHQ - 2 Score 0   Cognitive Testing - 6-CIT  Correct? Score   What year is it? yes 0 0 or 4  What month is it? yes 0 0 or 3  Memorize:    Pia Mau,  44,  [place],      What time is it? (within 1 hour) yes 0 0 or 3  Count backwards from 20 yes 0 0, 2, or 4  Name the months of the year yes 0 0, 2, or 4  Repeat name & address above yes 3 0, 2, 4, 6, 8, or 10       TOTAL SCORE  3/28   Interpretation:  Normal  Normal (0-7) Abnormal (8-28)    Audit-C Alcohol Use Screening  Question Answer Points  How often do you have alcoholic drink? never 0  On days you do drink alcohol, how many drinks do you typically consume? 0 0  How oftey will you drink 6 or more in a total? never 0  Total Score:  0   A score of 3 or more in women, and 4 or more in men indicates increased risk for alcohol abuse, EXCEPT if all of the points are from question 1.     Assessment & Plan:     Annual Wellness Visit  Reviewed patient's Family Medical History Reviewed and updated list of patient's medical providers Assessment of cognitive impairment was done Assessed patient's functional ability Established a written schedule for health screening [place] Completed and Reviewed  Exercise Activities and Dietary recommendations Goals    Continue to  use yard work and house work as her exercise program.      There is no immunization history on file for this patient.  Health Maintenance  Topic Date Due  . TETANUS/TDAP  10/12/1945  . ZOSTAVAX  10/12/1986  . DEXA SCAN  10/13/1991  . PNA vac Low Risk Adult (1 of 2 - PCV13) 10/13/1991  . INFLUENZA VACCINE  04/29/2016      Discussed health benefits of physical activity, and encouraged her to engage in regular exercise appropriate for her age and condition.    ------------------------------------------------------------------------------------------------------------ 1. Medicare annual wellness visit, subsequent General health in great shape for her age. On no medications regularly. Uses hearing aid in the right ear only. Still driving short  distances to familiar destinations in the daylight only. Eating 3 meals a day. No GI upset (constipation, dysphagia, diarrhea or dyspepsia). Working in her yard and house for exercise. Goes to [employer] to [employer] and taxes for 3 hours 2 days a week. Lives alone. Given flu shot today. Will get routine labs in the next week or two.  2. Age-related macular degeneration Takes vitamins and will see ophthalmologist in a month.  3. Need for influenza vaccination - Flu vaccine HIGH DOSE PF

## 2016-08-07 ENCOUNTER — Other Ambulatory Visit: Payer: Self-pay

## 2016-08-07 DIAGNOSIS — Z0000 Encounter for general adult medical examination without abnormal findings: Secondary | ICD-10-CM

## 2016-08-08 ENCOUNTER — Telehealth: Payer: Self-pay

## 2016-08-08 LAB — CBC WITH DIFFERENTIAL/PLATELET
BASOS: 1 %
Basophils Absolute: 0 10*3/uL (ref 0.0–0.2)
EOS (ABSOLUTE): 0.2 10*3/uL (ref 0.0–0.4)
Eos: 4 %
Hematocrit: 39 % (ref 34.0–46.6)
Hemoglobin: 12.7 g/dL (ref 11.1–15.9)
IMMATURE GRANS (ABS): 0 10*3/uL (ref 0.0–0.1)
IMMATURE GRANULOCYTES: 0 %
LYMPHS: 46 %
Lymphocytes Absolute: 2.7 10*3/uL (ref 0.7–3.1)
MCH: 30.3 pg (ref 26.6–33.0)
MCHC: 32.6 g/dL (ref 31.5–35.7)
MCV: 93 fL (ref 79–97)
Monocytes Absolute: 0.6 10*3/uL (ref 0.1–0.9)
Monocytes: 10 %
NEUTROS PCT: 39 %
Neutrophils Absolute: 2.2 10*3/uL (ref 1.4–7.0)
PLATELETS: 155 10*3/uL (ref 150–379)
RBC: 4.19 x10E6/uL (ref 3.77–5.28)
RDW: 14.2 % (ref 12.3–15.4)
WBC: 5.8 10*3/uL (ref 3.4–10.8)

## 2016-08-08 LAB — COMPREHENSIVE METABOLIC PANEL
A/G RATIO: 1.7 (ref 1.2–2.2)
ALT: 8 IU/L (ref 0–32)
AST: 17 IU/L (ref 0–40)
Albumin: 4 g/dL (ref 3.5–4.7)
Alkaline Phosphatase: 79 IU/L (ref 39–117)
BUN/Creatinine Ratio: 18 (ref 12–28)
BUN: 16 mg/dL (ref 8–27)
Bilirubin Total: 0.8 mg/dL (ref 0.0–1.2)
CALCIUM: 9.3 mg/dL (ref 8.7–10.3)
CO2: 26 mmol/L (ref 18–29)
CREATININE: 0.87 mg/dL (ref 0.57–1.00)
Chloride: 102 mmol/L (ref 96–106)
GFR, EST AFRICAN AMERICAN: 68 mL/min/{1.73_m2} (ref >59)
GFR, EST NON AFRICAN AMERICAN: 59 mL/min/{1.73_m2} — AB (ref >59)
GLUCOSE: 94 mg/dL (ref 65–99)
Globulin, Total: 2.4 g/dL (ref 1.5–4.5)
Potassium: 4.4 mmol/L (ref 3.5–5.2)
Sodium: 144 mmol/L (ref 134–144)
TOTAL PROTEIN: 6.4 g/dL (ref 6.0–8.5)

## 2016-08-08 NOTE — Telephone Encounter (Signed)
-----   Message from Margo Common, [place] sent at 08/08/2016  8:03 AM EST ----- All blood tests normal. Recheck in 6 months.

## 2016-08-08 NOTE — Telephone Encounter (Signed)
Patient has been advised. KW 

## 2017-06-03 ENCOUNTER — Telehealth: Payer: Self-pay | Admitting: Family Medicine

## 2017-07-31 ENCOUNTER — Telehealth: Payer: Self-pay | Admitting: Family Medicine

## 2017-07-31 NOTE — Telephone Encounter (Signed)
Pt is scheduled for AWV on 08/04/17 with NHA and CPE with Simona Huh on 08/07/17. Thanks TNP

## 2017-08-04 ENCOUNTER — Ambulatory Visit (INDEPENDENT_AMBULATORY_CARE_PROVIDER_SITE_OTHER): Payer: Medicare PPO

## 2017-08-04 VITALS — BP 108/54 | HR 72 | Temp 97.9°F | Ht 61.0 in | Wt 122.8 lb

## 2017-08-04 DIAGNOSIS — Z0000 Encounter for general adult medical examination without abnormal findings: Secondary | ICD-10-CM

## 2017-08-04 DIAGNOSIS — Z23 Encounter for immunization: Secondary | ICD-10-CM | POA: Diagnosis not present

## 2017-08-04 NOTE — Progress Notes (Signed)
Subjective:   Margaret Hall is a 83 y.o. female who presents for Medicare Annual (Subsequent) preventive examination.  Review of Systems:  N/A  Cardiac Risk Factors include: advanced age (>47men, >27 women)     Objective:     Vitals: BP (!) 108/54 (BP Location: Left Arm)   Pulse 72   Temp 97.9 F (36.6 C) (Oral)   Ht 5\' 1"  (1.549 m)   Wt 122 lb 12.8 oz (55.7 kg)   BMI 23.20 kg/m   Body mass index is 23.2 kg/m.   Tobacco Social History   Tobacco Use  Smoking Status Never Smoker  Smokeless Tobacco Never Used     Counseling given: Not Answered   Past Medical History:  Diagnosis Date  . Cancer [facility])    skin cancer- removed 10/06/16   Past Surgical History:  Procedure Laterality Date  . APPENDECTOMY  1965  . BREAST BIOPSY Right 2002  . BREAST LUMPECTOMY Right 2002  . HERNIA REPAIR  1974  . TUBAL LIGATION  1965   Family History  Problem Relation Age of Onset  . Melanoma Brother   . Stroke Brother   . Heart attack Brother   . Heart disease Sister    Social History   Substance and Sexual Activity  Sexual Activity No    Outpatient Encounter Medications as of 08/04/2017  Medication Sig  . acetaminophen (TYLENOL) 500 MG tablet Take 500 mg every 6 (six) hours as needed by mouth.  Marland Kitchen BISMUTH SUBSALICYLATE PO as needed.   . Multiple Vitamins-Minerals (EYE VITAMINS PO) Take by mouth.  . [DISCONTINUED] ibuprofen (ADVIL,MOTRIN) 200 MG tablet Take 200 mg by mouth as needed.   No facility-administered encounter medications on file as of 08/04/2017.     Activities of Daily Living In your present state of health, do you have any difficulty performing the following activities: 08/04/2017  Hearing? Y  Comment wears a right ear hearing aid  Vision? Y  Comment has MD  Difficulty concentrating or making decisions? N  Walking or climbing stairs? N  Dressing or bathing? N  Doing errands, shopping? N  Preparing Food and eating ? N  Using the Toilet? N  In the  past six months, have you accidently leaked urine? N  Do you have problems with loss of bowel control? N  Managing your Medications? N  Managing your Finances? N  Housekeeping or managing your Housekeeping? N  Some recent data might be hidden    Patient Care Team: Chrismon, Vickki Muff, PA as PCP - General (Family Medicine) Dingeldein, Remo Lipps, MD as Consulting Physician (Ophthalmology)    Assessment:     Exercise Activities and Dietary recommendations Current Exercise Habits: The patient does not participate in regular exercise at present(gardens, does yardwork and house work), Exercise limited by: None identified  Goals    None     Fall Risk Fall Risk  08/04/2017 07/29/2016  Falls in the past year? No No   Depression Screen PHQ 2/9 Scores 08/04/2017 07/29/2016  PHQ - 2 Score 0 0     Cognitive Function: Pt declined screening today.        Immunization History  Administered Date(s) Administered  . Influenza, High Dose Seasonal PF 07/29/2016, 08/04/2017   Screening Tests Health Maintenance  Topic Date Due  . TETANUS/TDAP  10/12/1945  . DEXA SCAN  10/13/1991  . PNA vac Low Risk Adult (1 of 2 - PCV13) 10/13/1991  . INFLUENZA VACCINE  Completed  Plan:  I have personally reviewed and addressed the Medicare Annual Wellness questionnaire and have noted the following in the patient's chart:  A. Medical and social history B. Use of alcohol, tobacco or illicit drugs  C. Current medications and supplements D. Functional ability and status E.  Nutritional status F.  Physical activity G. Advance directives H. List of other physicians I.  Hospitalizations, surgeries, and ER visits in previous 12 months J.  [place] such as hearing and vision if needed, cognitive and depression L. Referrals and appointments - none  In addition, I have reviewed and discussed with patient certain preventive protocols, quality metrics, and best practice recommendations. A  written personalized care plan for preventive services as well as general preventive health recommendations were provided to patient.  See attached scanned questionnaire for additional information.   Signed,  Fabio Neighbors, LPN Nurse Health Advisor   MD Recommendations: None. Pt declined a DEXA referral, the pneumonia and tetanus vaccine today.  Reviewed Nurse Health Advisor's note and was available for consultation today. Agree with documentation and note.

## 2017-08-04 NOTE — Patient Instructions (Signed)
Margaret Hall , Thank you for taking time to come for your Medicare Wellness Visit. I appreciate your ongoing commitment to your health goals. Please review the following plan we discussed and let me know if I can assist you in the future.   Screening recommendations/referrals: Colonoscopy: no longer required Mammogram: no longer required Bone Density: declined Recommended yearly ophthalmology/optometry visit for glaucoma screening and checkup Recommended yearly dental visit for hygiene and checkup  Vaccinations: Influenza vaccine: completed Pneumococcal vaccine: declined Tdap vaccine: declined Shingles vaccine: declined  Advanced directives: Advance directive discussed with you today. Even though you declined this today please call our office should you change your mind and we can give you the proper paperwork for you to fill out.  Conditions/risks identified: Recommend increasing water intake to 4-6 glasses a day.   Next appointment: 08/07/17   Preventive Care 65 Years and Older, Female Preventive care refers to lifestyle choices and visits with your health care provider that can promote health and wellness. What does preventive care include?  A yearly physical exam. This is also called an annual well check.  Dental exams once or twice a year.  Routine eye exams. Ask your health care provider how often you should have your eyes checked.  Personal lifestyle choices, including:  Daily care of your teeth and gums.  Regular physical activity.  Eating a healthy diet.  Avoiding tobacco and drug use.  Limiting alcohol use.  Practicing safe sex.  Taking low-dose aspirin every day.  Taking vitamin and mineral supplements as recommended by your health care provider. What happens during an annual well check? The services and screenings done by your health care provider during your annual well check will depend on your age, overall health, lifestyle risk factors, and family  history of disease. Counseling  Your health care provider may ask you questions about your:  Alcohol use.  Tobacco use.  Drug use.  Emotional well-being.  Home and relationship well-being.  Sexual activity.  Eating habits.  History of falls.  Memory and ability to understand (cognition).  Work and work Statistician.  Reproductive health. Screening  You may have the following tests or measurements:  Height, weight, and BMI.  Blood pressure.  Lipid and cholesterol levels. These may be checked every 5 years, or more frequently if you are over 61 years old.  Skin check.  Lung cancer screening. You may have this screening every year starting at age 38 if you have a 30-pack-year history of smoking and currently smoke or have quit within the past 15 years.  Fecal occult blood test (FOBT) of the stool. You may have this test every year starting at age 68.  Flexible sigmoidoscopy or colonoscopy. You may have a sigmoidoscopy every 5 years or a colonoscopy every 10 years starting at age 43.  Hepatitis C blood test.  Hepatitis B blood test.  Sexually transmitted disease (STD) testing.  Diabetes screening. This is done by checking your blood sugar (glucose) after you have not eaten for a while (fasting). You may have this done every 1-3 years.  Bone density scan. This is done to screen for osteoporosis. You may have this done starting at age 68.  Mammogram. This may be done every 1-2 years. Talk to your health care provider about how often you should have regular mammograms. Talk with your health care provider about your test results, treatment options, and if necessary, the need for more tests. Vaccines  Your health care provider may recommend certain vaccines, such  as:  Influenza vaccine. This is recommended every year.  Tetanus, diphtheria, and acellular pertussis (Tdap, Td) vaccine. You may need a Td booster every 10 years.  Zoster vaccine. You may need this after  age 46.  Pneumococcal 13-valent conjugate (PCV13) vaccine. One dose is recommended after age 85.  Pneumococcal polysaccharide (PPSV23) vaccine. One dose is recommended after age 31. Talk to your health care provider about which screenings and vaccines you need and how often you need them. This information is not intended to replace advice given to you by your health care provider. Make sure you discuss any questions you have with your health care provider. Document Released: 10/12/2015 Document Revised: 06/04/2016 Document Reviewed: 07/17/2015 Elsevier Interactive Patient Education  2017 [place] Prevention in the Home Falls can cause injuries. They can happen to people of all ages. There are many things you can do to make your home safe and to help prevent falls. What can I do on the outside of my home?  Regularly fix the edges of walkways and driveways and fix any cracks.  Remove anything that might make you trip as you walk through a door, such as a raised step or threshold.  Trim any bushes or trees on the path to your home.  Use bright outdoor lighting.  Clear any walking paths of anything that might make someone trip, such as rocks or tools.  Regularly check to see if handrails are loose or broken. Make sure that both sides of any steps have handrails.  Any raised decks and porches should have guardrails on the edges.  Have any leaves, snow, or ice cleared regularly.  Use sand or salt on walking paths during winter.  Clean up any spills in your garage right away. This includes oil or grease spills. What can I do in the bathroom?  Use night lights.  Install grab bars by the toilet and in the tub and shower. Do not use towel bars as grab bars.  Use non-skid mats or decals in the tub or shower.  If you need to sit down in the shower, use a plastic, non-slip stool.  Keep the floor dry. Clean up any water that spills on the floor as soon as it  happens.  Remove soap buildup in the tub or shower regularly.  Attach bath mats securely with double-sided non-slip rug tape.  Do not have throw rugs and other things on the floor that can make you trip. What can I do in the bedroom?  Use night lights.  Make sure that you have a light by your bed that is easy to reach.  Do not use any sheets or blankets that are too big for your bed. They should not hang down onto the floor.  Have a firm chair that has side arms. You can use this for support while you get dressed.  Do not have throw rugs and other things on the floor that can make you trip. What can I do in the kitchen?  Clean up any spills right away.  Avoid walking on wet floors.  Keep items that you use a lot in easy-to-reach places.  If you need to reach something above you, use a strong step stool that has a grab bar.  Keep electrical cords out of the way.  Do not use floor polish or wax that makes floors slippery. If you must use wax, use non-skid floor wax.  Do not have throw rugs and other things on the  floor that can make you trip. What can I do with my stairs?  Do not leave any items on the stairs.  Make sure that there are handrails on both sides of the stairs and use them. Fix handrails that are broken or loose. Make sure that handrails are as long as the stairways.  Check any carpeting to make sure that it is firmly attached to the stairs. Fix any carpet that is loose or worn.  Avoid having throw rugs at the top or bottom of the stairs. If you do have throw rugs, attach them to the floor with carpet tape.  Make sure that you have a light switch at the top of the stairs and the bottom of the stairs. If you do not have them, ask someone to add them for you. What else can I do to help prevent falls?  Wear shoes that:  Do not have high heels.  Have rubber bottoms.  Are comfortable and fit you well.  Are closed at the toe. Do not wear sandals.  If you  use a stepladder:  Make sure that it is fully opened. Do not climb a closed stepladder.  Make sure that both sides of the stepladder are locked into place.  Ask someone to hold it for you, if possible.  Clearly mark and make sure that you can see:  Any grab bars or handrails.  First and last steps.  Where the edge of each step is.  Use tools that help you move around (mobility aids) if they are needed. These include:  Canes.  Walkers.  Scooters.  Crutches.  Turn on the lights when you go into a dark area. Replace any light bulbs as soon as they burn out.  Set up your furniture so you have a clear path. Avoid moving your furniture around.  If any of your floors are uneven, fix them.  If there are any pets around you, be aware of where they are.  Review your medicines with your doctor. Some medicines can make you feel dizzy. This can increase your chance of falling. Ask your doctor what other things that you can do to help prevent falls. This information is not intended to replace advice given to you by your health care provider. Make sure you discuss any questions you have with your health care provider. Document Released: 07/12/2009 Document Revised: 02/21/2016 Document Reviewed: 10/20/2014 Elsevier Interactive Patient Education  2017 [employer].

## 2017-08-07 ENCOUNTER — Encounter: Payer: Self-pay | Admitting: Family Medicine

## 2017-08-07 ENCOUNTER — Ambulatory Visit (INDEPENDENT_AMBULATORY_CARE_PROVIDER_SITE_OTHER): Payer: Medicare PPO | Admitting: Family Medicine

## 2017-08-07 VITALS — BP 92/62 | HR 60 | Temp 97.5°F | Ht 61.0 in | Wt 119.8 lb

## 2017-08-07 DIAGNOSIS — H6123 Impacted cerumen, bilateral: Secondary | ICD-10-CM

## 2017-08-07 DIAGNOSIS — Z0000 Encounter for general adult medical examination without abnormal findings: Secondary | ICD-10-CM

## 2017-08-07 DIAGNOSIS — H3530 Unspecified macular degeneration: Secondary | ICD-10-CM | POA: Diagnosis not present

## 2017-08-07 LAB — CBC WITH DIFFERENTIAL/PLATELET
BASOS ABS: 40 {cells}/uL (ref 0–200)
Basophils Relative: 0.7 %
Eosinophils Absolute: 80 cells/uL (ref 15–500)
Eosinophils Relative: 1.4 %
HCT: 38.3 % (ref 35.0–45.0)
Hemoglobin: 13.1 g/dL (ref 11.7–15.5)
LYMPHS ABS: 2451 {cells}/uL (ref 850–3900)
MCH: 31.3 pg (ref 27.0–33.0)
MCHC: 34.2 g/dL (ref 32.0–36.0)
MCV: 91.6 fL (ref 80.0–100.0)
MONOS PCT: 8.3 %
MPV: 11.4 fL (ref 7.5–12.5)
NEUTROS PCT: 46.6 %
Neutro Abs: 2656 cells/uL (ref 1500–7800)
PLATELETS: 139 10*3/uL — AB (ref 140–400)
RBC: 4.18 10*6/uL (ref 3.80–5.10)
RDW: 12.4 % (ref 11.0–15.0)
TOTAL LYMPHOCYTE: 43 %
WBC mixed population: 473 cells/uL (ref 200–950)
WBC: 5.7 10*3/uL (ref 3.8–10.8)

## 2017-08-07 LAB — COMPLETE METABOLIC PANEL WITH GFR
AG Ratio: 1.5 (calc) (ref 1.0–2.5)
ALKALINE PHOSPHATASE (APISO): 58 U/L (ref 33–130)
ALT: 7 U/L (ref 6–29)
AST: 17 U/L (ref 10–35)
Albumin: 3.9 g/dL (ref 3.6–5.1)
BUN / CREAT RATIO: 17 (calc) (ref 6–22)
BUN: 16 mg/dL (ref 7–25)
CALCIUM: 9.1 mg/dL (ref 8.6–10.4)
CHLORIDE: 105 mmol/L (ref 98–110)
CO2: 28 mmol/L (ref 20–32)
Creat: 0.93 mg/dL — ABNORMAL HIGH (ref 0.60–0.88)
GFR, EST AFRICAN AMERICAN: 63 mL/min/{1.73_m2} (ref >=60)
GFR, Est Non African American: 54 mL/min/{1.73_m2} — ABNORMAL LOW (ref >=60)
GLOBULIN: 2.6 g/dL (ref 1.9–3.7)
GLUCOSE: 91 mg/dL (ref 65–99)
Potassium: 4.1 mmol/L (ref 3.5–5.3)
SODIUM: 142 mmol/L (ref 135–146)
Total Bilirubin: 0.9 mg/dL (ref 0.2–1.2)
Total Protein: 6.5 g/dL (ref 6.1–8.1)

## 2017-08-07 NOTE — Progress Notes (Signed)
Patient: Margaret Hall, Female    DOB: September 26, 1925, 83 y.o.   MRN: [id number] Visit Date: 08/07/2017  Today's Provider: [name], PA   Chief Complaint  Patient presents with  . Annual Exam   Subjective:    Annual physical exam Margaret Hall is a 83 y.o. female who presents today for health maintenance and complete physical. She feels well. She reports exercising doing yard work and house work. She reports she is sleeping well. She had a AWE on 08/04/2017. Patient declined bone density, pneumonia and tetanus vaccines. -----------------------------------------------------------------   Review of Systems  Constitutional: Negative.   HENT: Negative.   Eyes: Negative.   Respiratory: Negative.   Cardiovascular: Negative.   Gastrointestinal: Negative.   Endocrine: Negative.   Genitourinary: Negative.   Musculoskeletal: Negative.   Skin: Negative.   Allergic/Immunologic: Negative.   Neurological: Positive for dizziness (this morning when getting out of bed).  Hematological: Negative.   Psychiatric/Behavioral: Negative.     Social History      She  reports that  has never smoked. she has never used smokeless tobacco. She reports that she does not drink alcohol or use drugs.       Social History   Socioeconomic History  . Marital status: Widowed    Spouse name: None  . Number of children: None  . Years of education: None  . Highest education level: None  Social Needs  . Financial resource strain: None  . Food insecurity - worry: None  . Food insecurity - inability: None  . Transportation needs - medical: None  . Transportation needs - non-medical: None  Occupational History  . None  Tobacco Use  . Smoking status: Never Smoker  . Smokeless tobacco: Never Used  Substance and Sexual Activity  . Alcohol use: No    Alcohol/week: 0.0 oz  . Drug use: No  . Sexual activity: No  Other Topics Concern  . None  Social History Narrative  . None    Past  Medical History:  Diagnosis Date  . Cancer [facility])    skin cancer- removed 10/06/16     Patient Active Problem List   Diagnosis Date Noted  . Age-related macular degeneration 07/29/2016  . Difficulty hearing 08/06/2015  . Deep vein thrombosis (DVT), postpartum 08/06/2015  . Thrombophlebitis of deep veins of lower extremity ([place]) 08/06/2015  . Syncope and collapse 08/06/2015  . H/O malignant neoplasm of breast 12/03/2009    Past Surgical History:  Procedure Laterality Date  . APPENDECTOMY  1965  . BREAST BIOPSY Right 2002  . BREAST LUMPECTOMY Right 2002  . HERNIA REPAIR  1974  . [place]    Family History        Family Status  Relation Name Status  . Mother  Deceased at age 56       intestinal blockage  . Father  Deceased at age 29       cerebral hemorrhage  . Brother 1 Deceased  . Brother 2 Deceased at age 68  . Sister  (Not Specified)        Her family history includes Heart attack in her brother; Heart disease in her sister; Melanoma in her brother; Stroke in her brother.     Allergies  Allergen Reactions  . Sulfa Antibiotics Nausea Only  . Sulfasalazine Nausea Only     Current Outpatient Medications:  .  acetaminophen (TYLENOL) 500 MG tablet, Take 500 mg every 6 (six) hours as needed  by mouth., Disp: , Rfl:  .  BISMUTH SUBSALICYLATE PO, as needed. , Disp: , Rfl:  .  Multiple Vitamins-Minerals (EYE VITAMINS PO), Take by mouth., Disp: , Rfl:    Patient Care Team: Anzel Kearse, Vickki Muff, PA as PCP - General (Family Medicine) Dingeldein, Remo Lipps, MD as Consulting Physician (Ophthalmology)      Objective:   Vitals: BP 92/62 (BP Location: Right Arm, Patient Position: Sitting, Cuff Size: Normal)   Pulse 60   Temp (!) 97.5 F (36.4 C) (Oral)   Ht 5\' 1"  (1.549 m)   Wt 119 lb 12.8 oz (54.3 kg)   SpO2 99%   BMI 22.64 kg/m    Physical Exam  Constitutional: She is oriented to person, place, and time. She appears well-developed and well-nourished.    HENT:  Head: Normocephalic.  Nose: Nose normal.  Mouth/Throat: Oropharynx is clear and moist.  Diminished hearing. Wears a hearing aid in the right ear only. Cerumen occluding both canals.  Eyes: Conjunctivae and EOM are normal.  Neck: Neck supple. No thyromegaly present.  Cardiovascular: Normal rate and regular rhythm.  Pulmonary/Chest: Effort normal and breath sounds normal.  Abdominal: Soft. Bowel sounds are normal.  Genitourinary:  Genitourinary Comments: Deferred for her age.  Musculoskeletal: Normal range of motion.  Arthritic enlargement of several IP joints of fingers. Good grip strength.  Lymphadenopathy:    She has no cervical adenopathy.  Neurological: She is alert and oriented to person, place, and time. She has normal reflexes.  Skin: Skin is warm and dry.  Psychiatric: She has a normal mood and affect. Her behavior is normal. Thought content normal.   Depression Screen PHQ 2/9 Scores 08/04/2017 07/29/2016  PHQ - 2 Score 0 0    Assessment & Plan:     Routine Health Maintenance and Physical Exam  Exercise Activities and Dietary recommendations Goals    Continue working in her yard - mowing, tilling and gardening.      Immunization History  Administered Date(s) Administered  . Influenza, High Dose Seasonal PF 07/29/2016, 08/04/2017    Health Maintenance  Topic Date Due  . TETANUS/TDAP  10/12/1945  . DEXA SCAN  10/13/1991  . PNA vac Low Risk Adult (1 of 2 - PCV13) 10/13/1991  . INFLUENZA VACCINE  Completed     Discussed health benefits of physical activity, and encouraged her to engage in regular exercise appropriate for her age and condition.    -------------------------------------------------------------------- 1. Annual physical exam Good general heath for advanced age. Refuses immunizations and BMD. Continues to live alone, working at an [employer] (42 years) and doing all her own house or yard work. Will get routine labs and follow up  pending reports.  2. Age-related macular degeneration Stable and followed by ophthalmologist. Still has good enough vision to drive for herself.  3. Bilateral hearing loss due to cerumen impaction Wearing a hearing aid in the right ear. Wants irrigation today to remove cerumen occluding each ear canal.    [name], PA  [facility]

## 2017-09-16 NOTE — Telephone Encounter (Signed)
Visit complete.

## 2017-11-11 ENCOUNTER — Encounter: Payer: Self-pay | Admitting: Physician Assistant

## 2017-11-11 ENCOUNTER — Ambulatory Visit (INDEPENDENT_AMBULATORY_CARE_PROVIDER_SITE_OTHER): Payer: Medicare PPO | Admitting: Physician Assistant

## 2017-11-11 VITALS — BP 120/70 | HR 84 | Temp 98.6°F | Resp 16 | Wt 118.2 lb

## 2017-11-11 DIAGNOSIS — R059 Cough, unspecified: Secondary | ICD-10-CM

## 2017-11-11 DIAGNOSIS — R0602 Shortness of breath: Secondary | ICD-10-CM

## 2017-11-11 DIAGNOSIS — R05 Cough: Secondary | ICD-10-CM | POA: Diagnosis not present

## 2017-11-11 DIAGNOSIS — J441 Chronic obstructive pulmonary disease with (acute) exacerbation: Secondary | ICD-10-CM

## 2017-11-11 MED ORDER — BENZONATATE 100 MG PO CAPS: 100.0000 mg | ORAL_CAPSULE | Freq: Two times a day (BID) | ORAL | 0 refills | Status: DC | PRN

## 2017-11-11 MED ORDER — LEVOFLOXACIN 250 MG PO TABS: 250.0000 mg | ORAL_TABLET | Freq: Every day | ORAL | 0 refills | Status: DC

## 2017-11-11 NOTE — Patient Instructions (Signed)

## 2017-11-11 NOTE — Progress Notes (Addendum)
Patient: Margaret Hall Female    DOB: 01-24-1925   84 y.o.   MRN: [id number] Visit Date: 11/11/2017  Today's Provider: Mar Daring, PA-C   Chief Complaint  Patient presents with  . URI   Subjective:    URI   This is a new problem. The current episode started in the past 7 days (sunday night). The problem has been gradually worsening. There has been no fever. Associated symptoms include congestion (in her chest), coughing, ear pain (aching,bilateral), headaches and sneezing. Pertinent negatives include no chest pain, diarrhea, rhinorrhea, vomiting or wheezing. Sore throat: scrathy. She has tried decongestant, acetaminophen and increased fluids (cough syrup;gargles with salt) for the symptoms.      Allergies  Allergen Reactions  . Sulfa Antibiotics Nausea Only  . Sulfasalazine Nausea Only     Current Outpatient Medications:  .  acetaminophen (TYLENOL) 500 MG tablet, Take 500 mg every 6 (six) hours as needed by mouth., Disp: , Rfl:  .  BISMUTH SUBSALICYLATE PO, as needed. , Disp: , Rfl:  .  Multiple Vitamins-Minerals (EYE VITAMINS PO), Take by mouth., Disp: , Rfl:   Review of Systems  Constitutional: Positive for chills. Negative for fatigue.  HENT: Positive for congestion (in her chest), ear pain (aching,bilateral), postnasal drip, sinus pressure and sneezing. Negative for rhinorrhea and trouble swallowing. Sore throat: scrathy.   Eyes:       Watery eyes  Respiratory: Positive for cough. Negative for chest tightness, shortness of breath and wheezing.   Cardiovascular: Negative for chest pain, palpitations and leg swelling.  Gastrointestinal: Negative for diarrhea and vomiting.  Neurological: Positive for headaches. Negative for dizziness and numbness.    Social History   Tobacco Use  . Smoking status: Never Smoker  . Smokeless tobacco: Never Used  Substance Use Topics  . Alcohol use: No    Alcohol/week: 0.0 oz   Objective:   BP 120/70 (BP Location:  Left Arm, Patient Position: Sitting, Cuff Size: Normal)   Pulse 84   Temp 98.6 F (37 C) (Oral)   Resp 16   Wt 118 lb 3.2 oz (53.6 kg)   SpO2 96%   BMI 22.33 kg/m    Physical Exam  Constitutional: She appears well-developed and well-nourished. No distress.  HENT:  Head: Normocephalic and atraumatic.  Right Ear: Hearing, tympanic membrane, external ear and ear canal normal.  Left Ear: Hearing, tympanic membrane, external ear and ear canal normal.  Nose: Nose normal.  Mouth/Throat: Uvula is midline, oropharynx is clear and moist and mucous membranes are normal. No oropharyngeal exudate.  Eyes: Conjunctivae are normal. Pupils are equal, round, and reactive to light. Right eye exhibits no discharge. Left eye exhibits no discharge. No scleral icterus.  Neck: Normal range of motion. Neck supple. No tracheal deviation present. No thyromegaly present.  Cardiovascular: Normal rate, regular rhythm and normal heart sounds. Exam reveals no gallop and no friction rub.  No murmur heard. Pulmonary/Chest: Effort normal. No stridor. No respiratory distress. She has decreased breath sounds. She has no wheezes. She has no rhonchi. She has no rales.  Lymphadenopathy:    She has no cervical adenopathy.  Skin: Skin is warm and dry. She is not diaphoretic.  Vitals reviewed.       Assessment & Plan:     1. SOB (shortness of breath) Will get CXR due to patient's decreased breath sounds, significant cough and decreased O2 sat noted today in the office. Will start levaquin empirically. Tessalon  perles sent for cough. Push fluids. Call if symptoms worsen or fail to improve.  - DG Chest 2 View; Future  2. Cough See above medical treatment plan. - DG Chest 2 View; Future - benzonatate (TESSALON) 100 MG capsule; Take 1 capsule (100 mg total) by mouth 2 (two) times daily as needed for cough.  Dispense: 20 capsule; Refill: 0  3. Chronic obstructive pulmonary disease with acute exacerbation ([place]) See above  medical treatment plan. CXR clear for pneumonia but noted COPD changes. - levofloxacin (LEVAQUIN) 250 MG tablet; Take 1 tablet (250 mg total) by mouth daily.  Dispense: 7 tablet; Refill: 0       Mar Daring, PA-C  [facility]

## 2017-11-12 ENCOUNTER — Ambulatory Visit
Admission: RE | Admit: 2017-11-12 | Discharge: 2017-11-12 | Disposition: A | Discharge status: Home / Self Care | Payer: Medicare PPO | Source: Ambulatory Visit | Attending: Physician Assistant | Admitting: Physician Assistant

## 2017-11-12 ENCOUNTER — Telehealth: Payer: Self-pay

## 2017-11-12 DIAGNOSIS — R0602 Shortness of breath: Secondary | ICD-10-CM

## 2017-11-12 DIAGNOSIS — R918 Other nonspecific abnormal finding of lung field: Secondary | ICD-10-CM | POA: Diagnosis not present

## 2017-11-12 DIAGNOSIS — R05 Cough: Secondary | ICD-10-CM

## 2017-11-12 DIAGNOSIS — J441 Chronic obstructive pulmonary disease with (acute) exacerbation: Secondary | ICD-10-CM | POA: Insufficient documentation

## 2017-11-12 DIAGNOSIS — R059 Cough, unspecified: Secondary | ICD-10-CM

## 2017-11-12 NOTE — Telephone Encounter (Signed)
-----   Message from Mar Daring, PA-C sent at 11/12/2017  9:16 AM EST ----- No pneumonia noted but does have some COPD changes noted. Go ahead and continue antibiotic until completed. Call if symptoms do not fully improve. May need to see Margaret Hall in follow up about the noted COPD if she continues to have any SOB following illness.

## 2017-11-12 NOTE — Telephone Encounter (Signed)
Patient advised as directed below.  Thanks,  -Joseline 

## 2018-06-24 ENCOUNTER — Telehealth: Payer: Self-pay

## 2018-06-24 NOTE — Telephone Encounter (Signed)
LM for pt to CB and schedule her AWV for this year. Last AWV was 08/04/17. -MM

## 2018-07-07 NOTE — Telephone Encounter (Signed)
Pt called to verify who she is scheduled to see on 08/06/18 for AWV. I noticed that pt had her AWV/CPE last on 08/07/17. With pt coming in on 08/06/18 will that be ok per insurance or should the appt be rescheduled until after 08/07/18? Please advise. Thanks TNP

## 2018-07-09 NOTE — Telephone Encounter (Signed)
LMTCB. Needs to schedule passed last years date.  -MM

## 2018-07-13 NOTE — Telephone Encounter (Signed)
Rescheduled AWV for 08/16/18 @ 9:20 AM. -MM

## 2018-08-06 ENCOUNTER — Ambulatory Visit: Payer: Self-pay

## 2018-08-16 ENCOUNTER — Ambulatory Visit (INDEPENDENT_AMBULATORY_CARE_PROVIDER_SITE_OTHER): Payer: Medicare PPO

## 2018-08-16 VITALS — BP 118/54 | HR 86 | Temp 98.1°F | Ht 61.0 in | Wt 119.2 lb

## 2018-08-16 DIAGNOSIS — Z23 Encounter for immunization: Secondary | ICD-10-CM

## 2018-08-16 DIAGNOSIS — Z0000 Encounter for general adult medical examination without abnormal findings: Secondary | ICD-10-CM

## 2018-08-16 NOTE — Patient Instructions (Addendum)
[name] , Thank you for taking time to come for your Medicare Wellness Visit. I appreciate your ongoing commitment to your health goals. Please review the following plan we discussed and let me know if I can assist you in the future.   Screening recommendations/referrals: Colonoscopy: No longer required.  Mammogram: No longer required.  Bone Density: Pt declines today.  Recommended yearly ophthalmology/optometry visit for glaucoma screening and checkup Recommended yearly dental visit for hygiene and checkup  Vaccinations: Influenza vaccine: Administered today. Pneumococcal vaccine: Pt declines today.  Tdap vaccine: Pt declines today.  Shingles vaccine: Pt declines today.     Advanced directives: Already on file.  Conditions/risks identified: Continue to increase water intake to at least 4 glasses every day.   Next appointment: 08/23/18 @ 10:40 AM with Margaret Hall.   Preventive Care 11 Years and Older, Female Preventive care refers to lifestyle choices and visits with your health care provider that can promote health and wellness. What does preventive care include?  A yearly physical exam. This is also called an annual well check.  Dental exams once or twice a year.  Routine eye exams. Ask your health care provider how often you should have your eyes checked.  Personal lifestyle choices, including:  Daily care of your teeth and gums.  Regular physical activity.  Eating a healthy diet.  Avoiding tobacco and drug use.  Limiting alcohol use.  Practicing safe sex.  Taking low-dose aspirin every day.  Taking vitamin and mineral supplements as recommended by your health care provider. What happens during an annual well check? The services and screenings done by your health care provider during your annual well check will depend on your age, overall health, lifestyle risk factors, and family history of disease. Counseling  Your health care provider may ask you  questions about your:  Alcohol use.  Tobacco use.  Drug use.  Emotional well-being.  Home and relationship well-being.  Sexual activity.  Eating habits.  History of falls.  Memory and ability to understand (cognition).  Work and work Statistician.  Reproductive health. Screening  You may have the following tests or measurements:  Height, weight, and BMI.  Blood pressure.  Lipid and cholesterol levels. These may be checked every 5 years, or more frequently if you are over 14 years old.  Skin check.  Lung cancer screening. You may have this screening every year starting at age 48 if you have a 30-pack-year history of smoking and currently smoke or have quit within the past 15 years.  Fecal occult blood test (FOBT) of the stool. You may have this test every year starting at age 37.  Flexible sigmoidoscopy or colonoscopy. You may have a sigmoidoscopy every 5 years or a colonoscopy every 10 years starting at age 38.  Hepatitis C blood test.  Hepatitis B blood test.  Sexually transmitted disease (STD) testing.  Diabetes screening. This is done by checking your blood sugar (glucose) after you have not eaten for a while (fasting). You may have this done every 1-3 years.  Bone density scan. This is done to screen for osteoporosis. You may have this done starting at age 30.  Mammogram. This may be done every 1-2 years. Talk to your health care provider about how often you should have regular mammograms. Talk with your health care provider about your test results, treatment options, and if necessary, the need for more tests. Vaccines  Your health care provider may recommend certain vaccines, such as:  Influenza vaccine. This  is recommended every year.  Tetanus, diphtheria, and acellular pertussis (Tdap, Td) vaccine. You may need a Td booster every 10 years.  Zoster vaccine. You may need this after age 43.  Pneumococcal 13-valent conjugate (PCV13) vaccine. One dose is  recommended after age 69.  Pneumococcal polysaccharide (PPSV23) vaccine. One dose is recommended after age 58. Talk to your health care provider about which screenings and vaccines you need and how often you need them. This information is not intended to replace advice given to you by your health care provider. Make sure you discuss any questions you have with your health care provider. Document Released: 10/12/2015 Document Revised: 06/04/2016 Document Reviewed: 07/17/2015 Elsevier Interactive Patient Education  2017 [place] Prevention in the Home Falls can cause injuries. They can happen to people of all ages. There are many things you can do to make your home safe and to help prevent falls. What can I do on the outside of my home?  Regularly fix the edges of walkways and driveways and fix any cracks.  Remove anything that might make you trip as you walk through a door, such as a raised step or threshold.  Trim any bushes or trees on the path to your home.  Use bright outdoor lighting.  Clear any walking paths of anything that might make someone trip, such as rocks or tools.  Regularly check to see if handrails are loose or broken. Make sure that both sides of any steps have handrails.  Any raised decks and porches should have guardrails on the edges.  Have any leaves, snow, or ice cleared regularly.  Use sand or salt on walking paths during winter.  Clean up any spills in your garage right away. This includes oil or grease spills. What can I do in the bathroom?  Use night lights.  Install grab bars by the toilet and in the tub and shower. Do not use towel bars as grab bars.  Use non-skid mats or decals in the tub or shower.  If you need to sit down in the shower, use a plastic, non-slip stool.  Keep the floor dry. Clean up any water that spills on the floor as soon as it happens.  Remove soap buildup in the tub or shower regularly.  Attach bath mats  securely with double-sided non-slip rug tape.  Do not have throw rugs and other things on the floor that can make you trip. What can I do in the bedroom?  Use night lights.  Make sure that you have a light by your bed that is easy to reach.  Do not use any sheets or blankets that are too big for your bed. They should not hang down onto the floor.  Have a firm chair that has side arms. You can use this for support while you get dressed.  Do not have throw rugs and other things on the floor that can make you trip. What can I do in the kitchen?  Clean up any spills right away.  Avoid walking on wet floors.  Keep items that you use a lot in easy-to-reach places.  If you need to reach something above you, use a strong step stool that has a grab bar.  Keep electrical cords out of the way.  Do not use floor polish or wax that makes floors slippery. If you must use wax, use non-skid floor wax.  Do not have throw rugs and other things on the floor that can make you  trip. What can I do with my stairs?  Do not leave any items on the stairs.  Make sure that there are handrails on both sides of the stairs and use them. Fix handrails that are broken or loose. Make sure that handrails are as long as the stairways.  Check any carpeting to make sure that it is firmly attached to the stairs. Fix any carpet that is loose or worn.  Avoid having throw rugs at the top or bottom of the stairs. If you do have throw rugs, attach them to the floor with carpet tape.  Make sure that you have a light switch at the top of the stairs and the bottom of the stairs. If you do not have them, ask someone to add them for you. What else can I do to help prevent falls?  Wear shoes that:  Do not have high heels.  Have rubber bottoms.  Are comfortable and fit you well.  Are closed at the toe. Do not wear sandals.  If you use a stepladder:  Make sure that it is fully opened. Do not climb a closed  stepladder.  Make sure that both sides of the stepladder are locked into place.  Ask someone to hold it for you, if possible.  Clearly mark and make sure that you can see:  Any grab bars or handrails.  First and last steps.  Where the edge of each step is.  Use tools that help you move around (mobility aids) if they are needed. These include:  Canes.  Walkers.  Scooters.  Crutches.  Turn on the lights when you go into a dark area. Replace any light bulbs as soon as they burn out.  Set up your furniture so you have a clear path. Avoid moving your furniture around.  If any of your floors are uneven, fix them.  If there are any pets around you, be aware of where they are.  Review your medicines with your doctor. Some medicines can make you feel dizzy. This can increase your chance of falling. Ask your doctor what other things that you can do to help prevent falls. This information is not intended to replace advice given to you by your health care provider. Make sure you discuss any questions you have with your health care provider. Document Released: 07/12/2009 Document Revised: 02/21/2016 Document Reviewed: 10/20/2014 Elsevier Interactive Patient Education  2017 [employer].

## 2018-08-16 NOTE — Progress Notes (Addendum)
Subjective:   Margaret Hall is a 84 y.o. female who presents for Medicare Annual (Subsequent) preventive examination.  Review of Systems:  N/A  Cardiac Risk Factors include: advanced age (>61men, >47 women)     Objective:     Vitals: BP (!) 118/54 (BP Location: Left Arm)   Pulse 86   Temp 98.1 F (36.7 C) (Oral)   Ht 5\' 1"  (1.549 m)   Wt 119 lb 3.2 oz (54.1 kg)   BMI 22.52 kg/m   Body mass index is 22.52 kg/m.  Advanced Directives 08/16/2018 08/04/2017  Does Patient Have a Medical Advance Directive? Yes Yes  Type of Paramedic of [employer];Living will [place];Living will  Copy of [place] in Chart? Yes - validated most recent copy scanned in chart (See row information) No - copy requested    Tobacco Social History   Tobacco Use  Smoking Status Never Smoker  Smokeless Tobacco Never Used     Counseling given: Not Answered   Clinical Intake:  Pre-visit preparation completed: Yes  Pain : No/denies pain Pain Score: 0-No pain     Nutritional Status: BMI of 19-24  Normal Diabetes: No  How often do you need to have someone help you when you read instructions, pamphlets, or other written materials from your doctor or pharmacy?: 1 - Never  Interpreter Needed?: No  Information entered by :: [facility], LPN  Past Medical History:  Diagnosis Date  . Cancer [facility])    skin cancer- removed 10/06/16   Past Surgical History:  Procedure Laterality Date  . APPENDECTOMY  1965  . BREAST BIOPSY Right 2002  . BREAST LUMPECTOMY Right 2002  . HERNIA REPAIR  1974  . TUBAL LIGATION  1965   Family History  Problem Relation Age of Onset  . Melanoma Brother   . Stroke Brother   . Heart attack Brother   . Heart disease Sister    Social History   Socioeconomic History  . Marital status: Widowed    Spouse name: Not on file  . Number of children: 5  . Years of education: Not on file  . Highest  education level: Some college, no degree  Occupational History  . Occupation: retired  Scientific laboratory technician  . Financial resource strain: Not hard at all  . Food insecurity:    Worry: Never true    Inability: Never true  . Transportation needs:    Medical: No    Non-medical: No  Tobacco Use  . Smoking status: Never Smoker  . Smokeless tobacco: Never Used  Substance and Sexual Activity  . Alcohol use: No    Alcohol/week: 0.0 standard drinks  . Drug use: No  . Sexual activity: Never  Lifestyle  . Physical activity:    Days per week: 0 days    Minutes per session: 0 min  . Stress: Not at all  Relationships  . Social connections:    Talks on phone: Patient refused    Gets together: Patient refused    Attends religious service: Patient refused    Active member of club or organization: Patient refused    Attends meetings of clubs or organizations: Patient refused    Relationship status: Patient refused  Other Topics Concern  . Not on file  Social History Narrative  . Not on file    Outpatient Encounter Medications as of 08/16/2018  Medication Sig  . acetaminophen (TYLENOL) 500 MG tablet Take 500 mg every 6 (six)  hours as needed by mouth.  Marland Kitchen BISMUTH SUBSALICYLATE PO as needed.   . Multiple Vitamins-Minerals (EYE VITAMINS PO) Take by mouth 2 (two) times daily.   . benzonatate (TESSALON) 100 MG capsule Take 1 capsule (100 mg total) by mouth 2 (two) times daily as needed for cough. (Patient not taking: Reported on 08/16/2018)  . levofloxacin (LEVAQUIN) 250 MG tablet Take 1 tablet (250 mg total) by mouth daily. (Patient not taking: Reported on 08/16/2018)   No facility-administered encounter medications on file as of 08/16/2018.     Activities of Daily Living In your present state of health, do you have any difficulty performing the following activities: 08/16/2018  Hearing? Y  Comment Wears bilateral hearing aids.   Vision? Y  Comment Due to Macular Degeneration. Wears eye  glasses.   Difficulty concentrating or making decisions? N  Walking or climbing stairs? N  Dressing or bathing? N  Doing errands, shopping? N  Preparing Food and eating ? N  Using the Toilet? N  In the past six months, have you accidently leaked urine? N  Do you have problems with loss of bowel control? N  Managing your Medications? N  Managing your Finances? N  Housekeeping or managing your Housekeeping? N  Some recent data might be hidden    Patient Care Team: Chrismon, Vickki Muff, PA as PCP - General (Family Medicine) Dingeldein, Remo Lipps, MD as Consulting Physician (Ophthalmology) Baxter Kail, MD (Internal Medicine)    Assessment:   This is a routine wellness examination for [facility].  Exercise Activities and Dietary recommendations Current Exercise Habits: The patient does not participate in regular exercise at present, Exercise limited by: None identified  Goals    . Increase water intake     Recommend increasing water intake to 4-6 glasses a day.   08/16/18- Continue to increase water intake to at least 4 glasses every day.        Fall Risk Fall Risk  08/16/2018 08/04/2017 07/29/2016  Falls in the past year? 0 No No   FALL RISK PREVENTION PERTAINING TO THE HOME:  Any stairs in or around the home WITH handrails? Yes Home free of loose throw rugs in walkways, pet beds, electrical cords, etc? Yes  Adequate lighting in your home to reduce risk of falls? Yes   ASSISTIVE DEVICES UTILIZED TO PREVENT FALLS:  Life alert? No  Use of a cane, walker or w/c? No  Grab bars in the bathroom? Yes  Shower chair or bench in shower? No  Elevated toilet seat or a handicapped toilet? No    TIMED UP AND GO:  Was the test performed? No .     Depression Screen PHQ 2/9 Scores 08/16/2018 08/16/2018 08/04/2017 07/29/2016  PHQ - 2 Score 0 0 0 0  PHQ- 9 Score 0 - - -     Cognitive Function: Declined today.         Immunization History  Administered Date(s) Administered    . Influenza, High Dose Seasonal PF 07/29/2016, 08/04/2017    Qualifies for Shingles Vaccine? Yes . Due for Shingrix. Education has been provided regarding the importance of this vaccine. Pt has been advised to call insurance company to determine out of pocket expense. Advised may also receive vaccine at local pharmacy or Health Dept. Verbalized acceptance and understanding.  Tdap: Although this vaccine is not a covered service during a Wellness Exam, does the patient still wish to receive this vaccine today?  No .  Education has been provided regarding the  importance of this vaccine. Advised may receive this vaccine at local pharmacy or Health Dept. Aware to provide a copy of the vaccination record if obtained from local pharmacy or Health Dept. Verbalized acceptance and understanding.  Flu Vaccine: Due for Flu vaccine. Does the patient want to receive this vaccine today?  Yes .   Pneumococcal Vaccine: Due for Pneumococcal vaccine. Does the patient want to receive this vaccine today?  No . Education has been provided regarding the importance of this vaccine but still declined. Advised may receive this vaccine at local pharmacy or Health Dept. Aware to provide a copy of the vaccination record if obtained from local pharmacy or Health Dept. Verbalized acceptance and understanding.    Screening Tests Health Maintenance  Topic Date Due  . TETANUS/TDAP  10/12/1945  . DEXA SCAN  10/13/1991  . PNA vac Low Risk Adult (1 of 2 - PCV13) 10/13/1991  . INFLUENZA VACCINE  Completed   Cancer Screenings:  Colorectal Screening: No longer required.   Mammogram: No longer required.   Bone Density: Pt declines today.   Lung Cancer Screening: (Low Dose CT Chest recommended if Age 24-80 years, 30 pack-year currently smoking OR have quit w/in 15years.) does not qualify.    Additional Screening:  Vision Screening: Recommended annual ophthalmology exams for early detection of glaucoma and other disorders  of the eye.  Dental Screening: Recommended annual dental exams for proper oral hygiene  Community Resource Referral:  CRR required this visit?  No       Plan:  I have personally reviewed and addressed the Medicare Annual Wellness questionnaire and have noted the following in the patient's chart:  A. Medical and social history B. Use of alcohol, tobacco or illicit drugs  C. Current medications and supplements D. Functional ability and status E.  Nutritional status F.  Physical activity G. Advance directives H. List of other physicians I.  Hospitalizations, surgeries, and ER visits in previous 12 months J.  [place] such as hearing and vision if needed, cognitive and depression L. Referrals and appointments - none  In addition, I have reviewed and discussed with patient certain preventive protocols, quality metrics, and best practice recommendations. A written personalized care plan for preventive services as well as general preventive health recommendations were provided to patient.  See attached scanned questionnaire for additional information.   Signed,  Fabio Neighbors, LPN Nurse Health Advisor   Nurse Recommendations: Pt declined a DEXA referral, as well as a tetanus or pneumonia vaccine today.   Reviewed Nurse Health Advisor's documentation and recommendations. Was available for consultation during screening. Agree with plan and note.

## 2018-08-23 ENCOUNTER — Ambulatory Visit (INDEPENDENT_AMBULATORY_CARE_PROVIDER_SITE_OTHER): Payer: Medicare PPO | Admitting: Family Medicine

## 2018-08-23 ENCOUNTER — Encounter: Payer: Self-pay | Admitting: Family Medicine

## 2018-08-23 VITALS — BP 116/58 | HR 61 | Temp 97.7°F | Wt 118.4 lb

## 2018-08-23 DIAGNOSIS — M19041 Primary osteoarthritis, right hand: Secondary | ICD-10-CM

## 2018-08-23 DIAGNOSIS — H3530 Unspecified macular degeneration: Secondary | ICD-10-CM

## 2018-08-23 DIAGNOSIS — M19042 Primary osteoarthritis, left hand: Secondary | ICD-10-CM | POA: Diagnosis not present

## 2018-08-23 DIAGNOSIS — Z0000 Encounter for general adult medical examination without abnormal findings: Secondary | ICD-10-CM

## 2018-08-23 NOTE — Progress Notes (Signed)
Patient: Margaret Hall, Female    DOB: [birth year], 84 y.o.   MRN: [id number] Visit Date: 08/23/2018  Today's Provider: [name], PA   Chief Complaint  Patient presents with  . Annual Exam   Subjective:   Patient had a AWE with [place] on 08/16/2018.   Complete Physical Margaret Hall is a 84 y.o. female. She feels well. She reports exercising . She reports she is sleeping well.  -----------------------------------------------------------   Review of Systems  Constitutional: Negative.   HENT: Negative.   Eyes: Negative.   Respiratory: Negative.   Cardiovascular: Negative.   Gastrointestinal: Negative.        Acid reflux at times  Endocrine: Negative.   Genitourinary: Negative.   Musculoskeletal: Negative.   Skin: Negative.   Allergic/Immunologic: Negative.   Neurological: Negative.   Hematological: Negative.   Psychiatric/Behavioral: Negative.     Social History   Socioeconomic History  . Marital status: Widowed    Spouse name: Not on file  . Number of children: 5  . Years of education: Not on file  . Highest education level: Some college, no degree  Occupational History  . Occupation: retired  Scientific laboratory technician  . Financial resource strain: Not hard at all  . Food insecurity:    Worry: Never true    Inability: Never true  . Transportation needs:    Medical: No    Non-medical: No  Tobacco Use  . Smoking status: Never Smoker  . Smokeless tobacco: Never Used  Substance and Sexual Activity  . Alcohol use: No    Alcohol/week: 0.0 standard drinks  . Drug use: No  . Sexual activity: Never  Lifestyle  . Physical activity:    Days per week: 0 days    Minutes per session: 0 min  . Stress: Not at all  Relationships  . Social connections:    Talks on phone: Patient refused    Gets together: Patient refused    Attends religious service: Patient refused    Active member of club or organization: Patient refused    Attends meetings of  clubs or organizations: Patient refused    Relationship status: Patient refused  . Intimate partner violence:    Fear of current or ex partner: Patient refused    Emotionally abused: Patient refused    Physically abused: Patient refused    Forced sexual activity: Patient refused  Other Topics Concern  . Not on file  Social History Narrative  . Not on file    Past Medical History:  Diagnosis Date  . Cancer [facility])    skin cancer- removed 10/06/16     Patient Active Problem List   Diagnosis Date Noted  . Chronic obstructive pulmonary disease with acute exacerbation ([place]) 11/12/2017  . Age-related macular degeneration 07/29/2016  . Difficulty hearing 08/06/2015  . Deep vein thrombosis (DVT), postpartum 08/06/2015  . Thrombophlebitis of deep veins of lower extremity ([place]) 08/06/2015  . Syncope and collapse 08/06/2015  . H/O malignant neoplasm of breast 12/03/2009    Past Surgical History:  Procedure Laterality Date  . APPENDECTOMY  1965  . BREAST BIOPSY Right 2002  . BREAST LUMPECTOMY Right 2002  . HERNIA REPAIR  1974  . [place]    Her family history includes Heart attack in her brother; Heart disease in her sister; Melanoma in her brother; Stroke in her brother.      Current Outpatient Medications:  .  acetaminophen (TYLENOL) 500 MG tablet,  Take 500 mg every 6 (six) hours as needed by mouth., Disp: , Rfl:  .  BISMUTH SUBSALICYLATE PO, as needed. , Disp: , Rfl:  .  Multiple Vitamins-Minerals (EYE VITAMINS PO), Take by mouth 2 (two) times daily. , Disp: , Rfl:   Patient Care Team: Tonesha Tsou, Vickki Muff, PA as PCP - General (Family Medicine) Dingeldein, Remo Lipps, MD as Consulting Physician (Ophthalmology) Baxter Kail, MD (Internal Medicine)     Objective:   Vitals: BP (!) 116/58 (BP Location: Left Arm, Patient Position: Sitting, Cuff Size: Normal)   Pulse 61   Temp 97.7 F (36.5 C) (Oral)   Wt 118 lb 6.4 oz (53.7 kg)   SpO2 99%   BMI 22.37 kg/m    Physical Exam  Activities of Daily Living In your present state of health, do you have any difficulty performing the following activities: 08/16/2018  Hearing? Y  Comment Wears bilateral hearing aids.   Vision? Y  Comment Due to Macular Degeneration. Wears eye glasses.   Difficulty concentrating or making decisions? N  Walking or climbing stairs? N  Dressing or bathing? N  Doing errands, shopping? N  Preparing Food and eating ? N  Using the Toilet? N  In the past six months, have you accidently leaked urine? N  Do you have problems with loss of bowel control? N  Managing your Medications? N  Managing your Finances? N  Housekeeping or managing your Housekeeping? N  Some recent data might be hidden    Fall Risk Assessment Fall Risk  08/16/2018 08/04/2017 07/29/2016  Falls in the past year? 0 No No     Depression Screen PHQ 2/9 Scores 08/16/2018 08/16/2018 08/04/2017 07/29/2016  PHQ - 2 Score 0 0 0 0  PHQ- 9 Score 0 - - -    Assessment & Plan:    Annual Physical Reviewed patient's Family Medical History Reviewed and updated list of patient's medical providers Assessment of cognitive impairment was done Assessed patient's functional ability Established a written schedule for health screening [place] Completed and Reviewed  Exercise Activities and Dietary recommendations Goals    . Increase water intake     Recommend increasing water intake to 4-6 glasses a day.   08/16/18- Continue to increase water intake to at least 4 glasses every day.        Immunization History  Administered Date(s) Administered  . Influenza, High Dose Seasonal PF 07/29/2016, 08/04/2017, 08/16/2018    Health Maintenance  Topic Date Due  . DEXA SCAN  10/13/1991  . PNA vac Low Risk Adult (1 of 2 - PCV13) 08/24/2019 (Originally 10/13/1991)  . TETANUS/TDAP  08/24/2023 (Originally 10/12/1945)  . INFLUENZA VACCINE  Completed     Discussed health benefits of physical  activity, and encouraged her to engage in regular exercise appropriate for her age and condition.    ------------------------------------------------------------------------------------------------------------ 1. Age-related macular degeneration Stable and followed by ophthalmologist (Dr. Sandra Cockayne). Take multivitamin as recommended by her eye doctor. Vision good enough to allow her to drive during the day for very short distances. Recheck CBC and CMP for anemia or metabolic disorders. - CBC with Differential - Comprehensive Metabolic Panel (CMET)  2. Arthritis of finger of both hands Some stiffness and diminished grip strength. Continues to live alone and manage all her house and yard work. May use OTC NSAID if needed. Check routine labs and follow up as needed. - CBC with Differential - Comprehensive Metabolic Panel (CMET)  3. Annual physical exam Good general health. Appears  younger than stated years and very active. Reviewed documentation and recommendations of Nurse Health Advisor. Agree with plan. Refuses immunizations and BMD testing. Recheck annually or sooner pending routine labs.    [name], PA  [facility]

## 2018-08-24 LAB — CBC WITH DIFFERENTIAL/PLATELET
BASOS ABS: 0 10*3/uL (ref 0.0–0.2)
Basos: 1 %
EOS (ABSOLUTE): 0.1 10*3/uL (ref 0.0–0.4)
Eos: 2 %
HEMATOCRIT: 37.5 % (ref 34.0–46.6)
Hemoglobin: 12.7 g/dL (ref 11.1–15.9)
IMMATURE GRANS (ABS): 0 10*3/uL (ref 0.0–0.1)
IMMATURE GRANULOCYTES: 0 %
LYMPHS ABS: 2.3 10*3/uL (ref 0.7–3.1)
Lymphs: 43 %
MCH: 31.1 pg (ref 26.6–33.0)
MCHC: 33.9 g/dL (ref 31.5–35.7)
MCV: 92 fL (ref 79–97)
Monocytes Absolute: 0.4 10*3/uL (ref 0.1–0.9)
Monocytes: 7 %
NEUTROS ABS: 2.5 10*3/uL (ref 1.4–7.0)
Neutrophils: 47 %
Platelets: 185 10*3/uL (ref 150–450)
RBC: 4.08 x10E6/uL (ref 3.77–5.28)
RDW: 12.3 % (ref 12.3–15.4)
WBC: 5.3 10*3/uL (ref 3.4–10.8)

## 2018-08-24 LAB — COMPREHENSIVE METABOLIC PANEL
A/G RATIO: 1.7 (ref 1.2–2.2)
ALT: 8 IU/L (ref 0–32)
AST: 15 IU/L (ref 0–40)
Albumin: 4 g/dL (ref 3.2–4.6)
Alkaline Phosphatase: 65 IU/L (ref 39–117)
BUN/Creatinine Ratio: 19 (ref 12–28)
BUN: 15 mg/dL (ref 10–36)
Bilirubin Total: 0.5 mg/dL (ref 0.0–1.2)
CO2: 24 mmol/L (ref 20–29)
CREATININE: 0.8 mg/dL (ref 0.57–1.00)
Calcium: 9.1 mg/dL (ref 8.7–10.3)
Chloride: 105 mmol/L (ref 96–106)
GFR calc non Af Amer: 65 mL/min/{1.73_m2} (ref >59)
GFR, EST AFRICAN AMERICAN: 75 mL/min/{1.73_m2} (ref >59)
GLOBULIN, TOTAL: 2.3 g/dL (ref 1.5–4.5)
Glucose: 83 mg/dL (ref 65–99)
POTASSIUM: 4.3 mmol/L (ref 3.5–5.2)
SODIUM: 144 mmol/L (ref 134–144)
TOTAL PROTEIN: 6.3 g/dL (ref 6.0–8.5)

## 2018-08-25 ENCOUNTER — Telehealth: Payer: Self-pay

## 2018-08-25 NOTE — Telephone Encounter (Signed)
-----   Message from Margo Common, [place] sent at 08/24/2018  3:13 PM EST ----- All blood tests perfect. Recheck in a year.

## 2018-08-25 NOTE — Telephone Encounter (Signed)
Pt informed of lab results.  dbs

## 2018-10-22 ENCOUNTER — Ambulatory Visit (INDEPENDENT_AMBULATORY_CARE_PROVIDER_SITE_OTHER): Payer: Medicare Other | Admitting: Family Medicine

## 2018-10-22 ENCOUNTER — Encounter: Payer: Self-pay | Admitting: Family Medicine

## 2018-10-22 VITALS — BP 110/80 | HR 107 | Temp 97.7°F | Resp 16 | Wt 119.0 lb

## 2018-10-22 DIAGNOSIS — J0100 Acute maxillary sinusitis, unspecified: Secondary | ICD-10-CM

## 2018-10-22 MED ORDER — AMOXICILLIN 875 MG PO TABS: 875.0000 mg | ORAL_TABLET | Freq: Two times a day (BID) | ORAL | 0 refills | Status: DC

## 2018-10-22 NOTE — Progress Notes (Signed)
Patient: Margaret Hall Female    DOB: Nov 02, 1924   85 y.o.   MRN: [id number] Visit Date: 10/22/2018  Today's Provider: [name], PA   Chief Complaint  Patient presents with  . URI   Subjective:     URI   This is a new problem. The current episode started in the past 7 days (Monday night). The problem has been gradually worsening. There has been no fever. Associated symptoms include congestion, coughing, ear pain, headaches, a plugged ear sensation, rhinorrhea, sinus pain, a sore throat, swollen glands and wheezing ("a little in the AM today"). Associated symptoms comments: Body ache from her head down to her ribs. She has tried decongestant, acetaminophen and increased fluids (Home remedies, salt water gargle, cough drops) for the symptoms. The treatment provided no relief.   Past Medical History:  Diagnosis Date  . Cancer [facility])    skin cancer- removed 10/06/16   Past Surgical History:  Procedure Laterality Date  . APPENDECTOMY  1965  . BREAST BIOPSY Right 2002  . BREAST LUMPECTOMY Right 2002  . HERNIA REPAIR  1974  . TUBAL LIGATION  1965   Family History  Problem Relation Age of Onset  . Melanoma Brother   . Stroke Brother   . Heart attack Brother   . Heart disease Sister    Allergies  Allergen Reactions  . Sulfa Antibiotics Nausea Only  . Sulfasalazine Nausea Only    Current Outpatient Medications:  .  acetaminophen (TYLENOL) 500 MG tablet, Take 500 mg every 6 (six) hours as needed by mouth., Disp: , Rfl:  .  BISMUTH SUBSALICYLATE PO, as needed. , Disp: , Rfl:  .  Multiple Vitamins-Minerals (EYE VITAMINS PO), Take by mouth 2 (two) times daily. , Disp: , Rfl:   Review of Systems  Constitutional: Positive for chills. Negative for fever.  HENT: Positive for congestion, ear pain, postnasal drip, rhinorrhea, sinus pressure, sinus pain and sore throat. Negative for trouble swallowing.   Respiratory: Positive for cough and wheezing ("a little in the AM  today"). Negative for chest tightness and shortness of breath.   Neurological: Positive for headaches.   Social History   Tobacco Use  . Smoking status: Never Smoker  . Smokeless tobacco: Never Used  Substance Use Topics  . Alcohol use: No    Alcohol/week: 0.0 standard drinks     Objective:   BP 110/80 (BP Location: Right Arm, Patient Position: Sitting, Cuff Size: Normal)   Pulse (!) 107   Temp 97.7 F (36.5 C) (Oral)   Resp 16   Wt 119 lb (54 kg)   SpO2 98%   BMI 22.48 kg/m  Vitals:   10/22/18 1316  BP: 110/80  Pulse: (!) 107  Resp: 16  Temp: 97.7 F (36.5 C)  TempSrc: Oral  SpO2: 98%  Weight: 119 lb (54 kg)   Physical Exam Constitutional:      General: She is not in acute distress.    Appearance: She is well-developed.  HENT:     Head: Normocephalic and atraumatic.     Comments: Tender maxillary and ethmoid sinuses to palpate. No transillumination of the right maxillary sinus.    Right Ear: Hearing and tympanic membrane normal.     Left Ear: Hearing and tympanic membrane normal.     Ears:     Comments: Hearing deficit bilaterally - no wearing hearing aid today.    Nose: Nose normal.  Eyes:     General: Lids  are normal. No scleral icterus.       Right eye: No discharge.        Left eye: No discharge.     Conjunctiva/sclera: Conjunctivae normal.  Neck:     Musculoskeletal: Neck supple.  Cardiovascular:     Rate and Rhythm: Normal rate and regular rhythm.  Pulmonary:     Effort: Pulmonary effort is normal. No respiratory distress.  Musculoskeletal: Normal range of motion.  Lymphadenopathy:     Cervical: No cervical adenopathy.  Skin:    Findings: No lesion or rash.  Neurological:     Mental Status: She is alert and oriented to person, place, and time.  Psychiatric:        Speech: Speech normal.        Behavior: Behavior normal.        Thought Content: Thought content normal.       Assessment & Plan    1. Acute non-recurrent maxillary  sinusitis Onset 10-18-18 with sinus pressure, tenderness, cough with morning clear sputum production, swollen throat sensation but no fever. May use Mucinex-DM for cough and Flonase for sinus pressure/pain. Add antibiotic and may use Tylenol prn. Increase fluid intake and recheck prn. - amoxicillin (AMOXIL) 875 MG tablet; Take 1 tablet (875 mg total) by mouth 2 (two) times daily.  Dispense: 20 tablet; Refill: [place], PA  [facility]

## 2019-02-23 DIAGNOSIS — H353132 Nonexudative age-related macular degeneration, bilateral, intermediate dry stage: Secondary | ICD-10-CM | POA: Diagnosis not present

## 2019-06-01 ENCOUNTER — Other Ambulatory Visit: Payer: Self-pay | Admitting: Internal Medicine

## 2019-06-01 DIAGNOSIS — Z20822 Contact with and (suspected) exposure to covid-19: Secondary | ICD-10-CM

## 2019-06-01 DIAGNOSIS — R6889 Other general symptoms and signs: Secondary | ICD-10-CM | POA: Diagnosis not present

## 2019-06-02 LAB — NOVEL CORONAVIRUS, NAA: SARS-CoV-2, NAA: NOT DETECTED

## 2019-06-03 ENCOUNTER — Telehealth: Payer: Self-pay

## 2019-06-03 NOTE — Telephone Encounter (Signed)
Pts daughter Venida Jarvis on [place].returned call for COVID-19 lab result. Sherri states that she has viewed the negative result in My Chart. She has no questions.

## 2019-07-12 ENCOUNTER — Encounter: Payer: Self-pay | Admitting: Family Medicine

## 2019-07-12 ENCOUNTER — Other Ambulatory Visit: Payer: Self-pay

## 2019-07-12 ENCOUNTER — Ambulatory Visit (INDEPENDENT_AMBULATORY_CARE_PROVIDER_SITE_OTHER): Payer: Medicare Other | Admitting: Family Medicine

## 2019-07-12 VITALS — BP 120/80 | HR 88 | Temp 97.7°F | Resp 16 | Wt 112.8 lb

## 2019-07-12 DIAGNOSIS — I8002 Phlebitis and thrombophlebitis of superficial vessels of left lower extremity: Secondary | ICD-10-CM

## 2019-07-12 DIAGNOSIS — I8393 Asymptomatic varicose veins of bilateral lower extremities: Secondary | ICD-10-CM | POA: Diagnosis not present

## 2019-07-12 NOTE — Progress Notes (Signed)
Patient: Margaret Hall Female    DOB: June 29, 1925   85 y.o.   MRN: [id number] Visit Date: 07/12/2019  Today's Provider: [name], PA   Chief Complaint  Patient presents with  . Leg Pain   Subjective:     HPI Patient here today c/o left leg pain x's 2 days. Patient reports redness, swelling and pain. Patient denies any injuries or insect bites. Patient reports that she did use warm compress and reports some relief. Patient reports pain with bearring weight. Discomfort fading today.   Allergies  Allergen Reactions  . Sulfa Antibiotics Nausea Only  . Sulfasalazine Nausea Only     Current Outpatient Medications:  .  acetaminophen (TYLENOL) 500 MG tablet, Take 500 mg every 6 (six) hours as needed by mouth., Disp: , Rfl:  .  BISMUTH SUBSALICYLATE PO, as needed. , Disp: , Rfl:  .  Multiple Vitamins-Minerals (EYE VITAMINS PO), Take by mouth 2 (two) times daily. , Disp: , Rfl:   Review of Systems  Constitutional: Negative.   Respiratory: Negative.   Cardiovascular: Positive for leg swelling.  Skin: Positive for color change.    Social History   Tobacco Use  . Smoking status: Never Smoker  . Smokeless tobacco: Never Used  Substance Use Topics  . Alcohol use: No    Alcohol/week: 0.0 standard drinks      Objective:   BP 120/80 (BP Location: Left Arm, Patient Position: Sitting, Cuff Size: Normal)   Pulse 88   Temp 97.7 F (36.5 C) (Temporal)   Resp 16   Wt 112 lb 12.8 oz (51.2 kg)   SpO2 99%   BMI 21.31 kg/m  Vitals:   07/12/19 1553  BP: 120/80  Pulse: 88  Resp: 16  Temp: 97.7 F (36.5 C)  TempSrc: Temporal  SpO2: 99%  Weight: 112 lb 12.8 oz (51.2 kg)  Body mass index is 21.31 kg/m.  Physical Exam Constitutional:      General: She is not in acute distress.    Appearance: She is well-developed.  HENT:     Head: Normocephalic and atraumatic.     Right Ear: Hearing normal.     Left Ear: Hearing normal.     Nose: Nose normal.  Eyes:    General: Lids are normal. No scleral icterus.       Right eye: No discharge.        Left eye: No discharge.     Conjunctiva/sclera: Conjunctivae normal.  Pulmonary:     Effort: Pulmonary effort is normal. No respiratory distress.  Musculoskeletal: Normal range of motion.        General: Deformity present.     Comments: Enlarged IP joints of fingers. No deviation of alignment of fingers.  Skin:    Findings: Erythema present. No lesion or rash.     Comments: Small 2 x 4 cm pinkness and slight tenderness over the left anterior lower leg above ankle. Many large blue varicose veins over entire dorsum of both feet. 1-2+ pulses tibial and dorsalis pedis both feet. No edema. Ropey vein under erythema.  Neurological:     Mental Status: She is alert and oriented to person, place, and time.  Psychiatric:        Speech: Speech normal.        Behavior: Behavior normal.        Thought Content: Thought content normal.       Assessment & Plan    1. Superficial phlebitis of  left leg Ropey vein with slight tenderness and mild pinkness over the left lower leg above the anterior ankle that started 2-3 days ago. Has applied moist heat with improvement. Negative Homan's sign and not calf tenderness or swelling. Good pedal pulses that are symmetric. Treat superficial thrombophlebitis with Aleve, elevation and moist heat applications daily. Recheck in a week if any redness or discomfort. May need vascular imaging if persistent or worsening.  2. Asymptomatic varicose veins of both lower extremities Long term large blue varicose veins of both feet. No swelling or discomfort. Recommend elevation and support hose. Recheck prn.     [name], PA  [facility]

## 2019-07-12 NOTE — Patient Instructions (Signed)
Thrombophlebitis Thrombophlebitis is a condition in which a blood clot forms in a vein. This can happen in your arms or legs, or in the area between your neck and groin (torso). When this condition happens in a vein that is close to the surface of the body (superficial thrombophlebitis), it is usually not serious.However, when the condition happens in a vein that is deep inside the body (deep vein thrombosis, DVT), it can cause serious problems. What are the causes? This condition may be caused by:  Damage to a vein.  Inflammation of the veins.  A condition that causes blood to clot more easily.  Reduced blood flow through the veins. What increases the risk? The following factors may make you more likely to develop this condition:  Having a condition that makes blood thicker or more likely to clot.  Having an infection.  Having major surgery.  Experiencing a traumatic injury or a broken bone.  Having a catheter in a vein (central line).  Having a condition in which valves in the veins do not work properly, causing blood to collect (pool) in the veins (chronic venous insufficiency).  An inactive (sedentary) lifestyle.  Pregnancy or having recently given birth.  Cancer.  Older age, especially being 60 or older.  Obesity.  Smoking.  Taking medicines that contain estrogen, such as birth control pills.  Having varicose veins.  Using drugs that are injected into the veins (intravenous, IV). What are the signs or symptoms? The main symptoms of this condition are:  Swelling and pain in an arm or leg. If the affected vein is in the leg, you may feel pain while standing or walking.  Warmth or redness in an arm or leg. Other symptoms include:  Low-grade fever.  Muscle aches.  A bulging vein (venous distension). In some cases, there are no symptoms. How is this diagnosed? This condition may be diagnosed based on:  Your symptoms and medical history.  A physical exam.   Tests, such as: ? Blood tests. ? A test that uses sound waves to make images (ultrasound). How is this treated? Treatment depends on how severe the condition is and which area of the body is affected. Treatment may include:  Applying a warm compress or heating pad to affected areas.  Wearing compression stockings to help prevent blood clots and reduce swelling in your legs.  Raising (elevating) the affected arm or leg above the level of your heart.  Medicines, such as: ? Anti-inflammatory medicines, such as ibuprofen. ? Blood thinners (anticoagulants), such as heparin. ? Antibiotic medicine, if you have an infection.  Removing an IV that may be causing the problem. In rare cases, surgery may be needed to:  Remove a damaged section of a vein.  Place a filter in a large vein to catch blood clots before they reach the lungs. Follow these instructions at home: Medicines  Take over-the-counter and prescription medicines only as told by your health care provider.  If you were prescribed an antibiotic, take it as told by your health care provider. Do not stop using the antibiotic even if you feel better. Managing pain, stiffness, and swelling   If directed, put heat on the affected area as often as told by your health care provider. Use the heat source that your health care provider recommends, such as a moist heat pack or a heating pad. ? Place a towel between your skin and the heat source. ? Leave the heat on for 20-30 minutes. ? Remove the heat   if your skin turns bright red. This is especially important if you are not able to feel pain, heat, or cold. You may have a greater risk of getting burned.  Elevate the affected area above the level of your heart while you are sitting or lying down. Activity  Return to your normal activities as told by your health care provider. Ask your health care provider what activities are safe for you.  Avoid sitting or lying down for long  periods. If possible, stand up and walk around regularly. If you are taking blood thinners:  Take your medicine exactly as told, at the same time every day.  Avoid activities that could cause injury or bruising, and follow instructions about how to prevent falls.  Wear a medical alert bracelet or carry a card that lists what medicines you take. General instructions  Drink enough fluid to keep your urine pale yellow.  Wear compression stockings as told by your health care provider.  Do not use any products that contain nicotine or tobacco, such as cigarettes and e-cigarettes. If you need help quitting, ask your health care provider.  Keep all follow-up visits as told by your health care provider. This is important. Contact a health care provider if:  You miss a dose of your blood thinner, if applicable.  Your symptoms do not improve.  You have unusual bruising.  You have nausea, vomiting, or diarrhea that lasts for more than one day. Get help right away if:  You have any of these problems: ? New or worse pain, swelling, or redness in an arm or leg. ? Numbness or tingling in an arm or leg. ? Shortness of breath. ? Chest pain. ? Severe pain in your abdomen. ? Fast breathing. ? A fast or irregular heartbeat. ? Blood in your vomit, stool, or urine. ? A severe headache or confusion. ? A cut that does not stop bleeding.  You feel light-headed or dizzy.  You cough up blood.  You have a serious fall or accident, or you hit your head. These symptoms may represent a serious problem that is an emergency. Do not wait to see if the symptoms will go away. Get medical help right away. Call your local emergency services (911 in the [place] Do not drive yourself to the hospital. Summary  Thrombophlebitis is a condition in which a blood clot forms in a vein. This can happen in a vein close to the surface of the body or a vein deep inside the body.  This condition can cause serious  problems when it happens in a vein deep inside the body (deep vein thrombosis, DVT).  The main symptom of this condition is swelling and pain around the affected vein.  Treatment may include warm compresses, anti-inflammatory medicines, or blood thinners. This information is not intended to replace advice given to you by your health care provider. Make sure you discuss any questions you have with your health care provider. Document Released: 03/11/2017 Document Revised: 01/05/2019 Document Reviewed: 03/11/2017 Elsevier Patient Education  2020 Elsevier Inc.  

## 2019-07-22 ENCOUNTER — Telehealth: Payer: Self-pay

## 2019-07-22 ENCOUNTER — Telehealth: Payer: Self-pay | Admitting: Family Medicine

## 2019-07-22 DIAGNOSIS — R4781 Slurred speech: Secondary | ICD-10-CM | POA: Diagnosis not present

## 2019-07-22 DIAGNOSIS — G319 Degenerative disease of nervous system, unspecified: Secondary | ICD-10-CM | POA: Diagnosis not present

## 2019-07-22 DIAGNOSIS — R479 Unspecified speech disturbances: Secondary | ICD-10-CM | POA: Diagnosis not present

## 2019-07-22 DIAGNOSIS — J90 Pleural effusion, not elsewhere classified: Secondary | ICD-10-CM | POA: Diagnosis not present

## 2019-07-22 DIAGNOSIS — R404 Transient alteration of awareness: Secondary | ICD-10-CM | POA: Diagnosis not present

## 2019-07-22 DIAGNOSIS — G459 Transient cerebral ischemic attack, unspecified: Secondary | ICD-10-CM | POA: Diagnosis not present

## 2019-07-22 DIAGNOSIS — R29818 Other symptoms and signs involving the nervous system: Secondary | ICD-10-CM | POA: Diagnosis not present

## 2019-07-22 DIAGNOSIS — Z86718 Personal history of other venous thrombosis and embolism: Secondary | ICD-10-CM | POA: Diagnosis not present

## 2019-07-22 DIAGNOSIS — J811 Chronic pulmonary edema: Secondary | ICD-10-CM | POA: Diagnosis not present

## 2019-07-22 DIAGNOSIS — I4891 Unspecified atrial fibrillation: Secondary | ICD-10-CM | POA: Diagnosis not present

## 2019-07-22 DIAGNOSIS — Z79899 Other long term (current) drug therapy: Secondary | ICD-10-CM | POA: Diagnosis not present

## 2019-07-22 DIAGNOSIS — I671 Cerebral aneurysm, nonruptured: Secondary | ICD-10-CM | POA: Diagnosis not present

## 2019-07-22 DIAGNOSIS — R4701 Aphasia: Secondary | ICD-10-CM | POA: Diagnosis not present

## 2019-07-22 DIAGNOSIS — Z853 Personal history of malignant neoplasm of breast: Secondary | ICD-10-CM | POA: Diagnosis not present

## 2019-07-22 DIAGNOSIS — J449 Chronic obstructive pulmonary disease, unspecified: Secondary | ICD-10-CM | POA: Diagnosis not present

## 2019-07-22 NOTE — Telephone Encounter (Signed)
Pt called in complaining of slurred speech.  She states she has had "episodes" of it for two days. It only occurs for several minutes then her speech clears.  I advised her she would need to be evaluated at the ER.  She agreed and her daughter is going to take her.   Thanks,   -Mickel Baas

## 2019-07-22 NOTE — Telephone Encounter (Signed)
Acknowledge and agree with plan.

## 2019-07-23 DIAGNOSIS — I4891 Unspecified atrial fibrillation: Secondary | ICD-10-CM | POA: Diagnosis not present

## 2019-07-23 DIAGNOSIS — Z79899 Other long term (current) drug therapy: Secondary | ICD-10-CM | POA: Diagnosis not present

## 2019-07-23 DIAGNOSIS — G319 Degenerative disease of nervous system, unspecified: Secondary | ICD-10-CM | POA: Diagnosis not present

## 2019-07-23 DIAGNOSIS — Z853 Personal history of malignant neoplasm of breast: Secondary | ICD-10-CM | POA: Diagnosis not present

## 2019-07-23 DIAGNOSIS — R260 Ataxic gait: Secondary | ICD-10-CM | POA: Diagnosis not present

## 2019-07-23 DIAGNOSIS — R4701 Aphasia: Secondary | ICD-10-CM | POA: Diagnosis not present

## 2019-07-23 DIAGNOSIS — J449 Chronic obstructive pulmonary disease, unspecified: Secondary | ICD-10-CM | POA: Diagnosis not present

## 2019-07-23 DIAGNOSIS — Z7901 Long term (current) use of anticoagulants: Secondary | ICD-10-CM | POA: Diagnosis not present

## 2019-07-23 DIAGNOSIS — Z86718 Personal history of other venous thrombosis and embolism: Secondary | ICD-10-CM | POA: Diagnosis not present

## 2019-07-23 DIAGNOSIS — H3530 Unspecified macular degeneration: Secondary | ICD-10-CM | POA: Diagnosis not present

## 2019-07-23 DIAGNOSIS — Z8672 Personal history of thrombophlebitis: Secondary | ICD-10-CM | POA: Diagnosis not present

## 2019-07-23 DIAGNOSIS — I725 Aneurysm of other precerebral arteries: Secondary | ICD-10-CM | POA: Diagnosis not present

## 2019-07-23 DIAGNOSIS — Z882 Allergy status to sulfonamides status: Secondary | ICD-10-CM | POA: Diagnosis not present

## 2019-07-23 DIAGNOSIS — R4781 Slurred speech: Secondary | ICD-10-CM | POA: Diagnosis not present

## 2019-07-23 DIAGNOSIS — R569 Unspecified convulsions: Secondary | ICD-10-CM | POA: Diagnosis not present

## 2019-07-26 DIAGNOSIS — J90 Pleural effusion, not elsewhere classified: Secondary | ICD-10-CM | POA: Diagnosis not present

## 2019-07-26 DIAGNOSIS — Z8673 Personal history of transient ischemic attack (TIA), and cerebral infarction without residual deficits: Secondary | ICD-10-CM | POA: Diagnosis not present

## 2019-07-26 DIAGNOSIS — Z9011 Acquired absence of right breast and nipple: Secondary | ICD-10-CM | POA: Diagnosis not present

## 2019-07-26 DIAGNOSIS — J449 Chronic obstructive pulmonary disease, unspecified: Secondary | ICD-10-CM | POA: Diagnosis not present

## 2019-07-26 DIAGNOSIS — J811 Chronic pulmonary edema: Secondary | ICD-10-CM | POA: Diagnosis not present

## 2019-07-26 DIAGNOSIS — I4891 Unspecified atrial fibrillation: Secondary | ICD-10-CM | POA: Diagnosis not present

## 2019-07-26 DIAGNOSIS — Z8672 Personal history of thrombophlebitis: Secondary | ICD-10-CM | POA: Diagnosis not present

## 2019-07-26 DIAGNOSIS — Z79899 Other long term (current) drug therapy: Secondary | ICD-10-CM | POA: Diagnosis not present

## 2019-07-26 DIAGNOSIS — C50911 Malignant neoplasm of unspecified site of right female breast: Secondary | ICD-10-CM | POA: Diagnosis not present

## 2019-07-26 DIAGNOSIS — Z7901 Long term (current) use of anticoagulants: Secondary | ICD-10-CM | POA: Diagnosis not present

## 2019-07-26 DIAGNOSIS — E785 Hyperlipidemia, unspecified: Secondary | ICD-10-CM | POA: Diagnosis not present

## 2019-07-26 DIAGNOSIS — Z923 Personal history of irradiation: Secondary | ICD-10-CM | POA: Diagnosis not present

## 2019-07-28 DIAGNOSIS — I4891 Unspecified atrial fibrillation: Secondary | ICD-10-CM | POA: Insufficient documentation

## 2019-07-28 DIAGNOSIS — G459 Transient cerebral ischemic attack, unspecified: Secondary | ICD-10-CM | POA: Insufficient documentation

## 2019-07-28 DIAGNOSIS — I725 Aneurysm of other precerebral arteries: Secondary | ICD-10-CM | POA: Insufficient documentation

## 2019-08-09 DIAGNOSIS — G459 Transient cerebral ischemic attack, unspecified: Secondary | ICD-10-CM | POA: Diagnosis not present

## 2019-08-09 DIAGNOSIS — I725 Aneurysm of other precerebral arteries: Secondary | ICD-10-CM | POA: Diagnosis not present

## 2019-08-15 DIAGNOSIS — I4891 Unspecified atrial fibrillation: Secondary | ICD-10-CM | POA: Diagnosis not present

## 2019-09-03 IMAGING — CR DG CHEST 2V
1 series · 2 of 2 positions shown · non-contrast
Comparison: None

CLINICAL DATA: Cough, congestion, and shortness of breath

EXAM:
CHEST  2 VIEW

[Series 1: dg chest 2 view · 0.14mm/px · 2 of 2 slices shown]
[im 1/2]
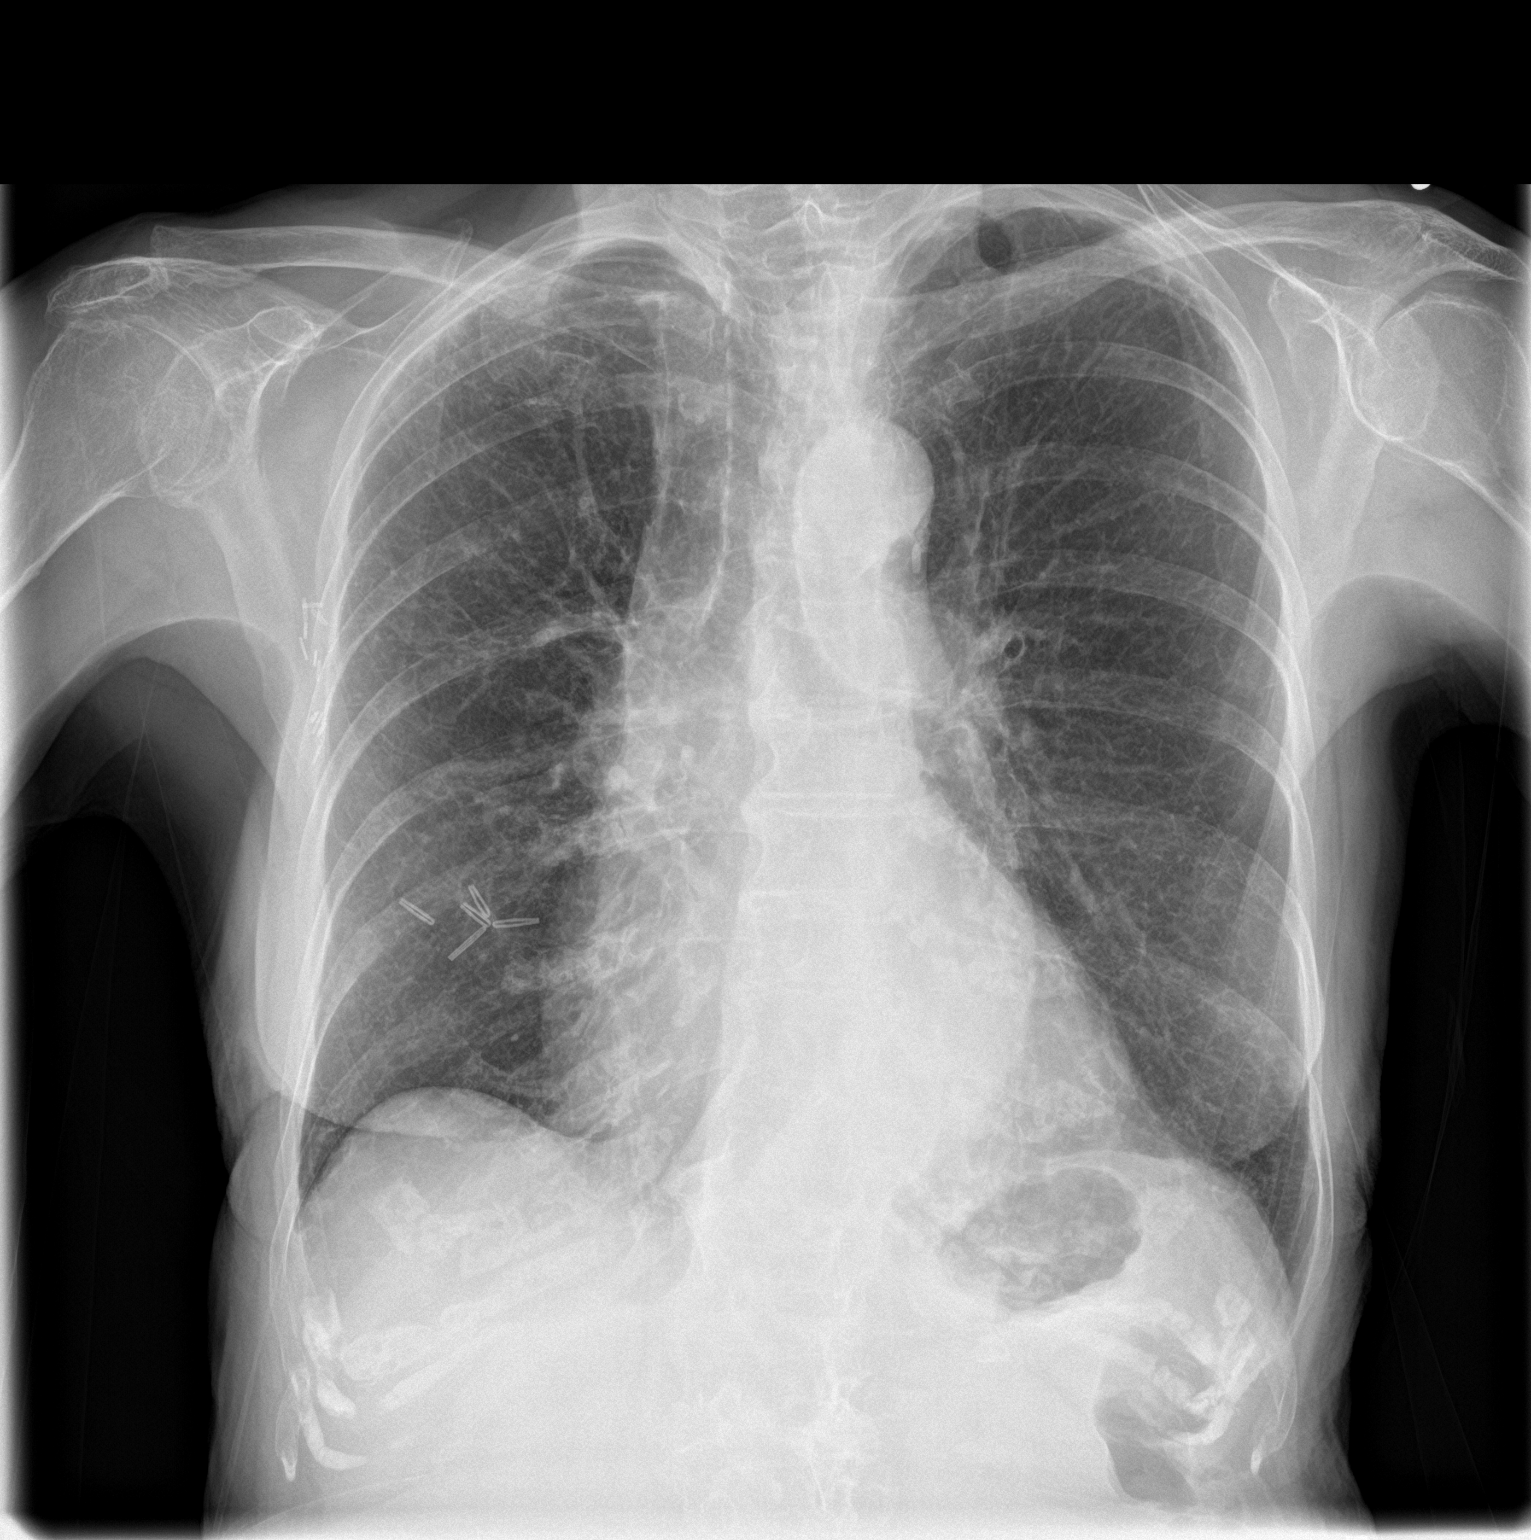
[im 2/2]
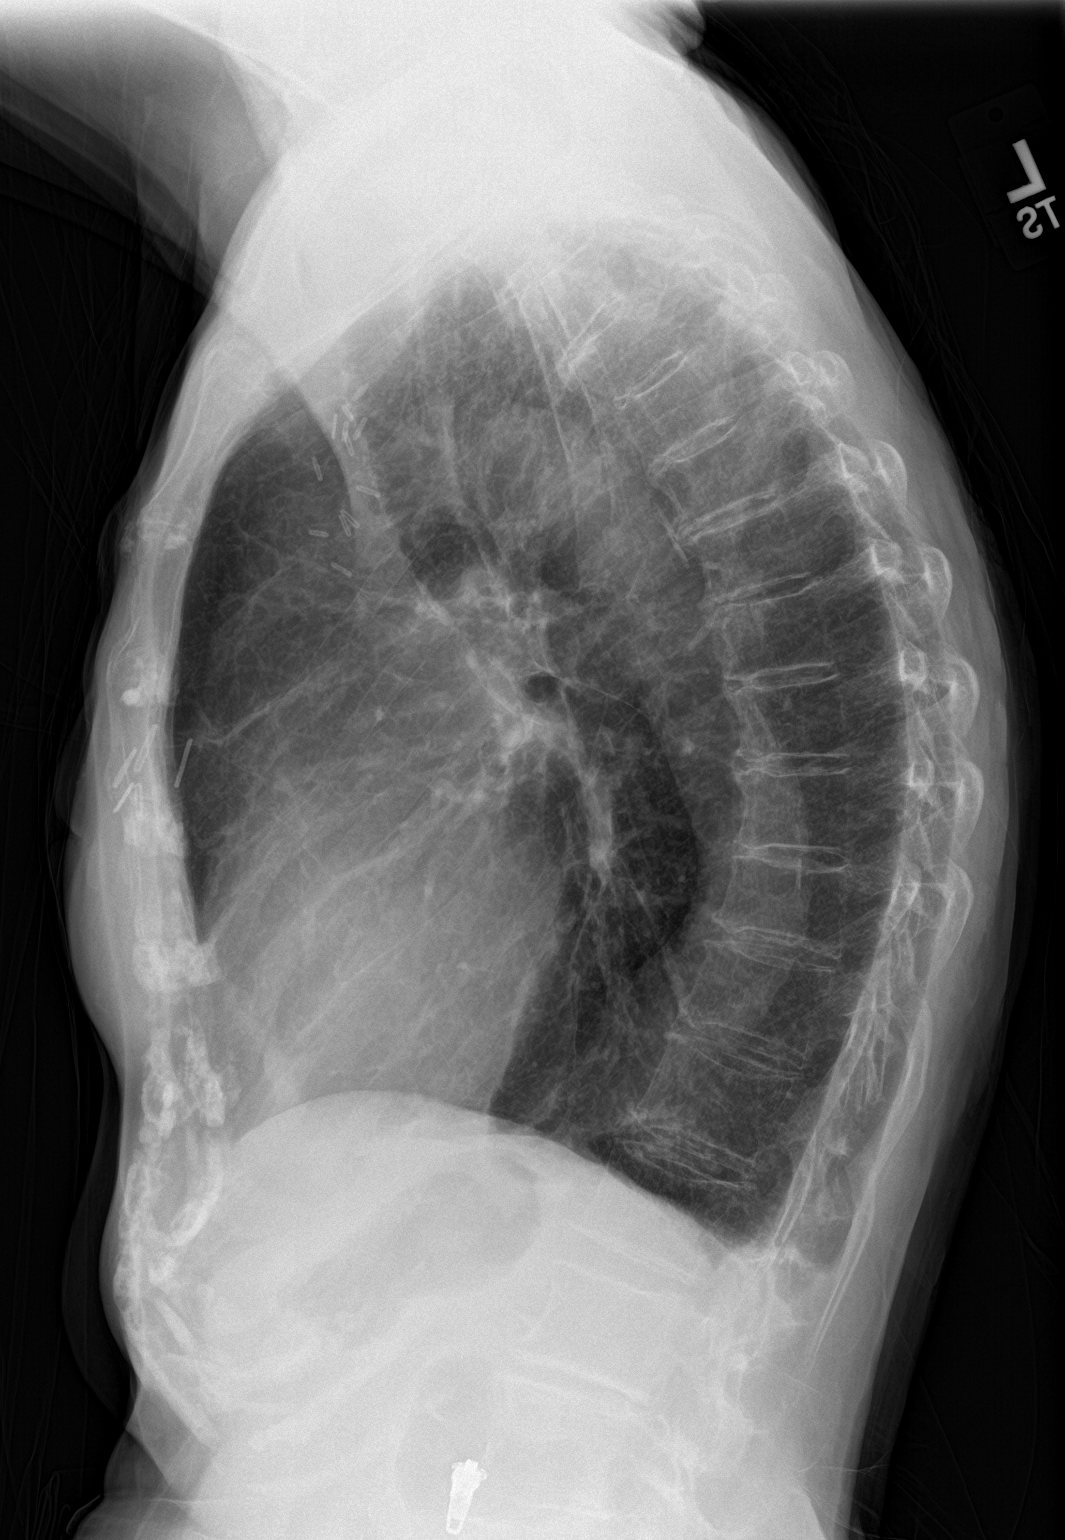

[2 of 2 positions shown; findings below may reference images not displayed]

FINDINGS: Upper normal heart size.

Mediastinal contours and pulmonary vascularity normal.

Atherosclerotic calcification aorta.

Emphysematous and bronchitic changes consistent with COPD.

Minimal diffuse interstitial prominence.

Lungs otherwise clear.

No pulmonary infiltrate, pleural effusion, or pneumothorax.

Bones demineralized.
IMPRESSION: COPD changes without acute infiltrate.

## 2019-09-16 ENCOUNTER — Encounter: Payer: Medicare Other | Admitting: Family Medicine

## 2019-09-26 DIAGNOSIS — H903 Sensorineural hearing loss, bilateral: Secondary | ICD-10-CM | POA: Diagnosis not present

## 2019-09-26 DIAGNOSIS — H6123 Impacted cerumen, bilateral: Secondary | ICD-10-CM | POA: Diagnosis not present

## 2019-10-03 ENCOUNTER — Encounter: Payer: Medicare Other | Admitting: Family Medicine

## 2019-10-17 ENCOUNTER — Encounter: Payer: Medicare Other | Admitting: Family Medicine

## 2019-10-30 DIAGNOSIS — I5020 Unspecified systolic (congestive) heart failure: Secondary | ICD-10-CM | POA: Insufficient documentation

## 2019-10-30 DIAGNOSIS — J90 Pleural effusion, not elsewhere classified: Secondary | ICD-10-CM | POA: Insufficient documentation

## 2019-11-01 DIAGNOSIS — I4891 Unspecified atrial fibrillation: Secondary | ICD-10-CM | POA: Diagnosis not present

## 2019-11-01 DIAGNOSIS — J90 Pleural effusion, not elsewhere classified: Secondary | ICD-10-CM | POA: Diagnosis not present

## 2019-11-01 DIAGNOSIS — I5020 Unspecified systolic (congestive) heart failure: Secondary | ICD-10-CM | POA: Diagnosis not present

## 2019-11-01 DIAGNOSIS — I725 Aneurysm of other precerebral arteries: Secondary | ICD-10-CM | POA: Diagnosis not present

## 2019-11-01 DIAGNOSIS — G459 Transient cerebral ischemic attack, unspecified: Secondary | ICD-10-CM | POA: Diagnosis not present

## 2019-11-04 ENCOUNTER — Encounter: Payer: Medicare Other | Admitting: Family Medicine

## 2019-12-06 ENCOUNTER — Telehealth: Payer: Self-pay

## 2019-12-06 NOTE — Telephone Encounter (Signed)
LMTCB to inquire about telephonic AWV prior to CPE on 12/19/19.

## 2019-12-07 ENCOUNTER — Ambulatory Visit (INDEPENDENT_AMBULATORY_CARE_PROVIDER_SITE_OTHER): Payer: Medicare Other

## 2019-12-07 ENCOUNTER — Other Ambulatory Visit: Payer: Self-pay

## 2019-12-07 DIAGNOSIS — Z0000 Encounter for general adult medical examination without abnormal findings: Secondary | ICD-10-CM

## 2019-12-07 NOTE — Patient Instructions (Signed)
Margaret Hall , Thank you for taking time to come for your Medicare Wellness Visit. I appreciate your ongoing commitment to your health goals. Please review the following plan we discussed and let me know if I can assist you in the future.   Screening recommendations/referrals: Colonoscopy: No longer required.  Mammogram: No longer required.  Bone Density: Currently due, pt declined order today. Recommended yearly ophthalmology/optometry visit for glaucoma screening and checkup Recommended yearly dental visit for hygiene and checkup  Vaccinations: Influenza vaccine: Pt declines today.  Pneumococcal vaccine: Pt declines today.  Tdap vaccine: Pt declines today.  Shingles vaccine: Pt declines today.     Advanced directives: Currently on file.  Conditions/risks identified: Continue to increase water intake to 6-8 8 oz glasses a day.   Next appointment: 12/19/19 @ 10:00 AM with Simona Huh Chrismon. Declined scheduling an AWV for 2022 at this time.    Preventive Care 41 Years and Older, Female Preventive care refers to lifestyle choices and visits with your health care provider that can promote health and wellness. What does preventive care include?  A yearly physical exam. This is also called an annual well check.  Dental exams once or twice a year.  Routine eye exams. Ask your health care provider how often you should have your eyes checked.  Personal lifestyle choices, including:  Daily care of your teeth and gums.  Regular physical activity.  Eating a healthy diet.  Avoiding tobacco and drug use.  Limiting alcohol use.  Practicing safe sex.  Taking low-dose aspirin every day.  Taking vitamin and mineral supplements as recommended by your health care provider. What happens during an annual well check? The services and screenings done by your health care provider during your annual well check will depend on your age, overall health, lifestyle risk factors, and family history of  disease. Counseling  Your health care provider may ask you questions about your:  Alcohol use.  Tobacco use.  Drug use.  Emotional well-being.  Home and relationship well-being.  Sexual activity.  Eating habits.  History of falls.  Memory and ability to understand (cognition).  Work and work Statistician.  Reproductive health. Screening  You may have the following tests or measurements:  Height, weight, and BMI.  Blood pressure.  Lipid and cholesterol levels. These may be checked every 5 years, or more frequently if you are over 84 years old.  Skin check.  Lung cancer screening. You may have this screening every year starting at age 84 if you have a 30-pack-year history of smoking and currently smoke or have quit within the past 15 years.  Fecal occult blood test (FOBT) of the stool. You may have this test every year starting at age 84.  Flexible sigmoidoscopy or colonoscopy. You may have a sigmoidoscopy every 5 years or a colonoscopy every 10 years starting at age 84.  Hepatitis C blood test.  Hepatitis B blood test.  Sexually transmitted disease (STD) testing.  Diabetes screening. This is done by checking your blood sugar (glucose) after you have not eaten for a while (fasting). You may have this done every 1-3 years.  Bone density scan. This is done to screen for osteoporosis. You may have this done starting at age 84.  Mammogram. This may be done every 1-2 years. Talk to your health care provider about how often you should have regular mammograms. Talk with your health care provider about your test results, treatment options, and if necessary, the need for more tests. Vaccines  Your health care provider may recommend certain vaccines, such as:  Influenza vaccine. This is recommended every year.  Tetanus, diphtheria, and acellular pertussis (Tdap, Td) vaccine. You may need a Td booster every 10 years.  Zoster vaccine. You may need this after age 84.   Pneumococcal 13-valent conjugate (PCV13) vaccine. One dose is recommended after age 84.  Pneumococcal polysaccharide (PPSV23) vaccine. One dose is recommended after age 84. Talk to your health care provider about which screenings and vaccines you need and how often you need them. This information is not intended to replace advice given to you by your health care provider. Make sure you discuss any questions you have with your health care provider. Document Released: 10/12/2015 Document Revised: 06/04/2016 Document Reviewed: 07/17/2015 Elsevier Interactive Patient Education  2017 [employer] Prevention in the Home Falls can cause injuries. They can happen to people of all ages. There are many things you can do to make your home safe and to help prevent falls. What can I do on the outside of my home?  Regularly fix the edges of walkways and driveways and fix any cracks.  Remove anything that might make you trip as you walk through a door, such as a raised step or threshold.  Trim any bushes or trees on the path to your home.  Use bright outdoor lighting.  Clear any walking paths of anything that might make someone trip, such as rocks or tools.  Regularly check to see if handrails are loose or broken. Make sure that both sides of any steps have handrails.  Any raised decks and porches should have guardrails on the edges.  Have any leaves, snow, or ice cleared regularly.  Use sand or salt on walking paths during winter.  Clean up any spills in your garage right away. This includes oil or grease spills. What can I do in the bathroom?  Use night lights.  Install grab bars by the toilet and in the tub and shower. Do not use towel bars as grab bars.  Use non-skid mats or decals in the tub or shower.  If you need to sit down in the shower, use a plastic, non-slip stool.  Keep the floor dry. Clean up any water that spills on the floor as soon as it happens.  Remove soap  buildup in the tub or shower regularly.  Attach bath mats securely with double-sided non-slip rug tape.  Do not have throw rugs and other things on the floor that can make you trip. What can I do in the bedroom?  Use night lights.  Make sure that you have a light by your bed that is easy to reach.  Do not use any sheets or blankets that are too big for your bed. They should not hang down onto the floor.  Have a firm chair that has side arms. You can use this for support while you get dressed.  Do not have throw rugs and other things on the floor that can make you trip. What can I do in the kitchen?  Clean up any spills right away.  Avoid walking on wet floors.  Keep items that you use a lot in easy-to-reach places.  If you need to reach something above you, use a strong step stool that has a grab bar.  Keep electrical cords out of the way.  Do not use floor polish or wax that makes floors slippery. If you must use wax, use non-skid floor wax.  Do  not have throw rugs and other things on the floor that can make you trip. What can I do with my stairs?  Do not leave any items on the stairs.  Make sure that there are handrails on both sides of the stairs and use them. Fix handrails that are broken or loose. Make sure that handrails are as long as the stairways.  Check any carpeting to make sure that it is firmly attached to the stairs. Fix any carpet that is loose or worn.  Avoid having throw rugs at the top or bottom of the stairs. If you do have throw rugs, attach them to the floor with carpet tape.  Make sure that you have a light switch at the top of the stairs and the bottom of the stairs. If you do not have them, ask someone to add them for you. What else can I do to help prevent falls?  Wear shoes that:  Do not have high heels.  Have rubber bottoms.  Are comfortable and fit you well.  Are closed at the toe. Do not wear sandals.  If you use a stepladder:  Make  sure that it is fully opened. Do not climb a closed stepladder.  Make sure that both sides of the stepladder are locked into place.  Ask someone to hold it for you, if possible.  Clearly mark and make sure that you can see:  Any grab bars or handrails.  First and last steps.  Where the edge of each step is.  Use tools that help you move around (mobility aids) if they are needed. These include:  Canes.  Walkers.  Scooters.  Crutches.  Turn on the lights when you go into a dark area. Replace any light bulbs as soon as they burn out.  Set up your furniture so you have a clear path. Avoid moving your furniture around.  If any of your floors are uneven, fix them.  If there are any pets around you, be aware of where they are.  Review your medicines with your doctor. Some medicines can make you feel dizzy. This can increase your chance of falling. Ask your doctor what other things that you can do to help prevent falls. This information is not intended to replace advice given to you by your health care provider. Make sure you discuss any questions you have with your health care provider. Document Released: 07/12/2009 Document Revised: 02/21/2016 Document Reviewed: 10/20/2014 Elsevier Interactive Patient Education  2017 [employer].

## 2019-12-07 NOTE — Progress Notes (Addendum)
Subjective:   IN Margaret Hall is a 86 y.o. female who presents for Medicare Annual (Subsequent) preventive examination.    This visit is being conducted through telemedicine due to the COVID-19 pandemic. This patient has given me verbal consent via doximity to conduct this visit, patient states they are participating from their home address. Some vital signs may be absent or patient reported.    Patient identification: identified by name, DOB, and current address  Review of Systems:  N/A  Cardiac Risk Factors include: advanced age (>11men, >24 women);dyslipidemia;hypertension     Objective:     Vitals: There were no vitals taken for this visit.  There is no height or weight on file to calculate BMI. Unable to obtain vitals due to visit being conducted via telephonically.   Advanced Directives 12/07/2019 08/16/2018 08/04/2017  Does Patient Have a Medical Advance Directive? Yes Yes Yes  Type of Paramedic of [place];Living will [place];Living will [place];Living will  Copy of [place] in Chart? Yes - validated most recent copy scanned in chart (See row information) Yes - validated most recent copy scanned in chart (See row information) No - copy requested    Tobacco Social History   Tobacco Use  Smoking Status Never Smoker  Smokeless Tobacco Never Used     Counseling given: Not Answered   Clinical Intake:  Pre-visit preparation completed: Yes  Pain : No/denies pain Pain Score: 0-No pain     Nutritional Risks: None Diabetes: No  How often do you need to have someone help you when you read instructions, pamphlets, or other written materials from your doctor or pharmacy?: 1 - Never  Interpreter Needed?: No  Information entered by :: [facility], LPN  Past Medical History:  Diagnosis Date   Cancer ([place])    skin cancer- removed 10/06/16   Past Surgical History:  Procedure  Laterality Date   APPENDECTOMY  1965   BREAST BIOPSY Right 2002   BREAST LUMPECTOMY Right 2002   [place]   Family History  Problem Relation Age of Onset   Melanoma Brother    Stroke Brother    Heart attack Brother    Heart disease Sister    Social History   Socioeconomic History   Marital status: Widowed    Spouse name: Not on file   Number of children: 5   Years of education: Not on file   Highest education level: Some college, no degree  Occupational History   Occupation: retired  Tobacco Use   Smoking status: Never Smoker   Smokeless tobacco: Never Used  Substance and Sexual Activity   Alcohol use: No    Alcohol/week: 0.0 standard drinks   Drug use: No   Sexual activity: Never  Other Topics Concern   Not on file  Social History Narrative   Not on file   Social Determinants of Health   Financial Resource Strain: Low Risk    Difficulty of Paying Living Expenses: Not hard at all  Food Insecurity: No Food Insecurity   Worried About Charity fundraiser in the Last Year: Never true   [place] in the Last Year: Never true  Transportation Needs: No Transportation Needs   Lack of Transportation (Medical): No   Lack of Transportation (Non-Medical): No  Physical Activity: Inactive   Days of Exercise per Week: 0 days   Minutes of Exercise per  Session: 0 min  Stress: No Stress Concern Present   Feeling of Stress : Not at all  Social Connections: Moderately Isolated   Frequency of Communication with Friends and Family: More than three times a week   Frequency of Social Gatherings with Friends and Family: More than three times a week   Attends Religious Services: Never   Marine scientist or Organizations: No   Attends Archivist Meetings: Never   Marital Status: Widowed    Outpatient Encounter Medications as of 12/07/2019  Medication Sig   acetaminophen (TYLENOL) 500 MG tablet Take 500 mg every 6 (six) hours  as needed by mouth.   apixaban (ELIQUIS) 2.5 MG TABS tablet Take 2.5 mg by mouth 2 (two) times daily.    atorvastatin (LIPITOR) 20 MG tablet Take 20 mg by mouth daily at 6 PM.    BISMUTH SUBSALICYLATE PO as needed.    Multiple Vitamins-Minerals (EYE VITAMINS PO) Take by mouth 2 (two) times daily.    No facility-administered encounter medications on file as of 12/07/2019.    Activities of Daily Living In your present state of health, do you have any difficulty performing the following activities: 12/07/2019  Hearing? Y  Comment Wears bilateral hearing aids.  Vision? N  Difficulty concentrating or making decisions? N  Walking or climbing stairs? N  Dressing or bathing? N  Doing errands, shopping? N  Preparing Food and eating ? N  Using the Toilet? N  In the past six months, have you accidently leaked urine? N  Do you have problems with loss of bowel control? N  Managing your Medications? N  Managing your Finances? N  Housekeeping or managing your Housekeeping? N  Some recent data might be hidden    Patient Care Team: Chrismon, Vickki Muff, PA as PCP - General (Family Medicine) Dingeldein, Remo Lipps, MD as Consulting Physician (Ophthalmology) Baxter Kail, MD (Internal Medicine) Precious Reel, MD as Referring Physician (Internal Medicine) Lawana Pai, MD (Inactive) as Consulting Physician (Neurology)    Assessment:   This is a routine wellness examination for [facility].  Exercise Activities and Dietary recommendations Current Exercise Habits: The patient does not participate in regular exercise at present, Exercise limited by: None identified  Goals      Increase water intake     Recommend increasing water intake to 4-6 glasses a day.   08/16/18- Continue to increase water intake to at least 4 glasses every day.         Fall Risk: Fall Risk  12/07/2019 07/12/2019 08/16/2018 08/04/2017 07/29/2016  Falls in the past year? 0 0 0 No No  Number falls in past yr: 0 0 - - -    Injury with Fall? 0 0 - - -    FALL RISK PREVENTION PERTAINING TO THE HOME:  Any stairs in or around the home? Yes  If so, are there any without handrails? No   Home free of loose throw rugs in walkways, pet beds, electrical cords, etc? Yes  Adequate lighting in your home to reduce risk of falls? Yes   ASSISTIVE DEVICES UTILIZED TO PREVENT FALLS:  Life alert? Yes  Use of a cane, walker or w/c? No  Grab bars in the bathroom? Yes  Shower chair or bench in shower? No  Elevated toilet seat or a handicapped toilet? No    TIMED UP AND GO:  Was the test performed? No .    Depression Screen PHQ 2/9 Scores 12/07/2019 08/16/2018 08/16/2018 08/04/2017  PHQ -  2 Score 0 0 0 0  PHQ- 9 Score - 0 - -     Cognitive Function        Immunization History  Administered Date(s) Administered   Influenza, High Dose Seasonal PF 07/29/2016, 08/04/2017, 08/16/2018   PFIZER SARS-COV-2 Vaccination 10/15/2019, 11/07/2019    Qualifies for Shingles Vaccine? Yes . Due for Shingrix. Pt has been advised to call insurance company to determine out of pocket expense. Advised may also receive vaccine at local pharmacy or Health Dept. Verbalized acceptance and understanding.  Tdap: Although this vaccine is not a covered service during a Wellness Exam, does the patient still wish to receive this vaccine today?  No . Advised may receive this vaccine at local pharmacy or Health Dept. Aware to provide a copy of the vaccination record if obtained from local pharmacy or Health Dept. Verbalized acceptance and understanding.  Flu Vaccine: Due for Flu vaccine. Does the patient want to receive this vaccine today?  No . Advised may receive this vaccine at local pharmacy or Health Dept. Aware to provide a copy of the vaccination record if obtained from local pharmacy or Health Dept. Verbalized acceptance and understanding.  Pneumococcal Vaccine: Due for Pneumococcal vaccine. Does the patient want to receive this vaccine  today?  No . Advised may receive this vaccine at local pharmacy or Health Dept. Aware to provide a copy of the vaccination record if obtained from local pharmacy or Health Dept. Verbalized acceptance and understanding.   Screening Tests Health Maintenance  Topic Date Due   INFLUENZA VACCINE  12/28/2019 (Originally 04/30/2019)   DEXA SCAN  12/06/2020 (Originally 10/13/1991)   PNA vac Low Risk Adult (1 of 2 - PCV13) 12/06/2020 (Originally 10/13/1991)   TETANUS/TDAP  08/24/2023 (Originally 10/12/1945)    Cancer Screenings:  Colorectal Screening: No longer required.   Mammogram: No longer required.   Bone Density: Currently due. Pt declined an order for this.   Lung Cancer Screening: (Low Dose CT Chest recommended if Age 47-80 years, 30 pack-year currently smoking OR have quit w/in 15years.) does not qualify.   Additional Screening:  Vision Screening: Recommended annual ophthalmology exams for early detection of glaucoma and other disorders of the eye.  Dental Screening: Recommended annual dental exams for proper oral hygiene  Community Resource Referral:  CRR required this visit?  No       Plan:  I have personally reviewed and addressed the Medicare Annual Wellness questionnaire and have noted the following in the patient's chart:  A. Medical and social history B. Use of alcohol, tobacco or illicit drugs  C. Current medications and supplements D. Functional ability and status E.  Nutritional status F.  Physical activity G. Advance directives H. List of other physicians I.  Hospitalizations, surgeries, and ER visits in previous 12 months J.  [place] such as hearing and vision if needed, cognitive and depression L. Referrals and appointments   In addition, I have reviewed and discussed with patient certain preventive protocols, quality metrics, and best practice recommendations. A written personalized care plan for preventive services as well as general preventive  health recommendations were provided to patient. Nurse Health Advisor  Signed,    Charo Philipp [place], [place]  [phone] Nurse Health Advisor   Nurse Notes: Pt declined an order for a DEXA scan. Pt also declined receiving a future flu or pneumonia shot.   Reviewed note and plan of Nurse Health Advisor. Agree with documentation and recommendations.

## 2019-12-13 NOTE — Telephone Encounter (Signed)
Completed 12/07/19.

## 2019-12-19 ENCOUNTER — Encounter: Payer: Medicare Other | Admitting: Family Medicine

## 2019-12-26 ENCOUNTER — Ambulatory Visit (INDEPENDENT_AMBULATORY_CARE_PROVIDER_SITE_OTHER): Payer: Medicare Other | Admitting: Family Medicine

## 2019-12-26 ENCOUNTER — Other Ambulatory Visit: Payer: Self-pay

## 2019-12-26 ENCOUNTER — Encounter: Payer: Self-pay | Admitting: Family Medicine

## 2019-12-26 VITALS — BP 118/72 | HR 107 | Temp 96.9°F | Ht 61.0 in | Wt 112.0 lb

## 2019-12-26 DIAGNOSIS — R5383 Other fatigue: Secondary | ICD-10-CM | POA: Diagnosis not present

## 2019-12-26 DIAGNOSIS — Z0000 Encounter for general adult medical examination without abnormal findings: Secondary | ICD-10-CM | POA: Diagnosis not present

## 2019-12-26 DIAGNOSIS — I639 Cerebral infarction, unspecified: Secondary | ICD-10-CM

## 2019-12-26 DIAGNOSIS — R634 Abnormal weight loss: Secondary | ICD-10-CM | POA: Diagnosis not present

## 2019-12-26 NOTE — Progress Notes (Signed)
Patient: Margaret Hall, Female    DOB: Feb 19, 1925, 86 y.o.   MRN: [id number] Visit Date: 12/26/2019  Today's Provider: [name], PA   Chief Complaint  Patient presents with  . Annual Exam   Subjective:  Margaret Hall is a 86 y.o. female who presents today for health maintenance and complete physical. She feels well. She reports exercising none. She reports she is sleeping well.  Immunization History  Administered Date(s) Administered  . Influenza, High Dose Seasonal PF 07/29/2016, 08/04/2017, 08/16/2018  . PFIZER SARS-COV-2 Vaccination 10/15/2019, 11/07/2019    Review of Systems  Constitutional: Positive for fatigue and unexpected weight change (after stroke, picking it back up now).  HENT: Negative.   Eyes: Negative.   Respiratory: Negative.   Cardiovascular: Negative.   Gastrointestinal: Positive for abdominal distention and abdominal pain.  Endocrine: Negative.   Genitourinary: Negative.   Musculoskeletal: Negative.   Skin: Negative.   Allergic/Immunologic: Negative.   Neurological: Negative.   Hematological: Negative.   Psychiatric/Behavioral: Negative.     Social History   Socioeconomic History  . Marital status: Widowed    Spouse name: Not on file  . Number of children: 5  . Years of education: Not on file  . Highest education level: Some college, no degree  Occupational History  . Occupation: retired  Tobacco Use  . Smoking status: Never Smoker  . Smokeless tobacco: Never Used  Substance and Sexual Activity  . Alcohol use: No    Alcohol/week: 0.0 standard drinks  . Drug use: No  . Sexual activity: Never  Other Topics Concern  . Not on file  Social History Narrative  . Not on file   Social Determinants of Health   Financial Resource Strain: Low Risk   . Difficulty of Paying Living Expenses: Not hard at all  Food Insecurity: No Food Insecurity  . Worried About Charity fundraiser in the Last Year: Never true  . Ran Out of Food in the  Last Year: Never true  Transportation Needs: No Transportation Needs  . Lack of Transportation (Medical): No  . Lack of Transportation (Non-Medical): No  Physical Activity: Inactive  . Days of Exercise per Week: 0 days  . Minutes of Exercise per Session: 0 min  Stress: No Stress Concern Present  . Feeling of Stress : Not at all  Social Connections: Moderately Isolated  . Frequency of Communication with Friends and Family: More than three times a week  . Frequency of Social Gatherings with Friends and Family: More than three times a week  . Attends Religious Services: Never  . Active Member of Clubs or Organizations: No  . Attends Archivist Meetings: Never  . Marital Status: Widowed  Intimate Partner Violence: Not At Risk  . Fear of Current or Ex-Partner: No  . Emotionally Abused: No  . Physically Abused: No  . Sexually Abused: No    Patient Active Problem List   Diagnosis Date Noted  . Chronic obstructive pulmonary disease with acute exacerbation ([place]) 11/12/2017  . Age-related macular degeneration 07/29/2016  . Difficulty hearing 08/06/2015  . Deep vein thrombosis (DVT), postpartum 08/06/2015  . Thrombophlebitis of deep veins of lower extremity ([place]) 08/06/2015  . Syncope and collapse 08/06/2015  . H/O malignant neoplasm of breast 12/03/2009    Past Surgical History:  Procedure Laterality Date  . APPENDECTOMY  1965  . BREAST BIOPSY Right 2002  . BREAST LUMPECTOMY Right 2002  . HERNIA REPAIR  1974  . TUBAL LIGATION  1965    Her family history includes Heart attack in her brother; Heart disease in her sister; Melanoma in her brother; Stroke in her brother.     Outpatient Encounter Medications as of 12/26/2019  Medication Sig  . acetaminophen (TYLENOL) 500 MG tablet Take 500 mg every 6 (six) hours as needed by mouth.  Marland Kitchen apixaban (ELIQUIS) 2.5 MG TABS tablet Take 2.5 mg by mouth 2 (two) times daily.   Marland Kitchen atorvastatin (LIPITOR) 20 MG tablet Take 20 mg by mouth  daily at 6 PM.   . BISMUTH SUBSALICYLATE PO as needed.   . Multiple Vitamins-Minerals (EYE VITAMINS PO) Take by mouth 2 (two) times daily.    No facility-administered encounter medications on file as of 12/26/2019.    Patient Care Team: Chrismon, Vickki Muff, PA as PCP - General (Family Medicine) Dingeldein, Remo Lipps, MD as Consulting Physician (Ophthalmology) Baxter Kail, MD (Internal Medicine) Precious Reel, MD as Referring Physician (Internal Medicine) Lawana Pai, MD (Inactive) as Consulting Physician (Neurology)      Objective:   Vitals:  Vitals:   12/26/19 1058  BP: 118/72  Pulse: (!) 107  Temp: (!) 96.9 F (36.1 C)  TempSrc: Skin  SpO2: 99%  Weight: 112 lb (50.8 kg)  Height: 5\' 1"  (1.549 m)  Body mass index is 21.16 kg/m.  Wt Readings from Last 3 Encounters:  12/26/19 112 lb (50.8 kg)  07/12/19 112 lb 12.8 oz (51.2 kg)  10/22/18 119 lb (54 kg)     Physical Exam Constitutional:      Appearance: Normal appearance. She is normal weight.  HENT:     Head: Normocephalic and atraumatic.     Right Ear: Tympanic membrane, ear canal and external ear normal.     Left Ear: Tympanic membrane, ear canal and external ear normal.     Nose: Nose normal.     Mouth/Throat:     Mouth: Mucous membranes are moist.     Pharynx: Oropharynx is clear.  Eyes:     Extraocular Movements: Extraocular movements intact.     Conjunctiva/sclera: Conjunctivae normal.     Pupils: Pupils are equal, round, and reactive to light.  Cardiovascular:     Rate and Rhythm: Normal rate and regular rhythm.     Pulses: Normal pulses.     Heart sounds: Normal heart sounds.  Pulmonary:     Effort: Pulmonary effort is normal.     Breath sounds: Normal breath sounds.  Abdominal:     General: Abdomen is flat. Bowel sounds are normal.     Palpations: Abdomen is soft.  Musculoskeletal:        General: Normal range of motion.     Cervical back: Normal range of motion and neck supple.  Skin:     General: Skin is warm and dry.  Neurological:     General: No focal deficit present.     Mental Status: She is alert and oriented to person, place, and time. Mental status is at baseline.  Psychiatric:        Mood and Affect: Mood normal.        Behavior: Behavior normal.        Thought Content: Thought content normal.        Judgment: Judgment normal.    Depression Screen PHQ 2/9 Scores 12/07/2019 08/16/2018 08/16/2018 08/04/2017  PHQ - 2 Score 0 0 0 0  PHQ- 9 Score - 0 - -    Assessment & Plan:     Routine Health Maintenance  and Physical Exam  Exercise Activities and Dietary recommendations Goals    . Increase water intake     Recommend increasing water intake to 4-6 glasses a day.   08/16/18- Continue to increase water intake to at least 4 glasses every day.        Immunization History  Administered Date(s) Administered  . Influenza, High Dose Seasonal PF 07/29/2016, 08/04/2017, 08/16/2018  . PFIZER SARS-COV-2 Vaccination 10/15/2019, 11/07/2019    Health Maintenance  Topic Date Due  . INFLUENZA VACCINE  12/28/2019 (Originally 04/30/2019)  . DEXA SCAN  12/06/2020 (Originally 10/13/1991)  . PNA vac Low Risk Adult (1 of 2 - PCV13) 12/06/2020 (Originally 10/13/1991)  . TETANUS/TDAP  08/24/2023 (Originally 10/12/1945)     Discussed health benefits of physical activity, and encouraged her to engage in regular exercise appropriate for her age and condition.    1. Annual physical exam General health stable. Refuses flu shot and got her COVID vaccinations on 10-15-19 and 11-07-19. Continue present medications and recheck labs.  2. Weight loss Stable at 112 lbs (BMI 21) since October 2020. With some fatigue but no dizziness/ataxia, will encouraged to eat 3 regular meals a day and recheck routine labs. - CBC with Differential/Platelet - Comprehensive metabolic panel - TSH  3. Cerebrovascular accident (CVA), unspecified mechanism ([place]) History of TIA due to new onset A.Fib in  October 2020 with slurred speech. Patient and family feels she has returned to her normal baseline without any unilateral weakness, dizziness or persistent dysphasia. Eating well and normal balance for her age today. Will check labs and follow up pending reports. Still taking Eliquis 2.5 mg BID with Atorvastatin 20 mg qd. Followed at 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 - CBC with Differential/Platelet - Lipid Panel With LDL/HDL Ratio  4. Fatigue, unspecified type Not feeling as energetic as in the past. Still working in her yard occasionally, but would like to do more. Will check routine labs to assess for anemia or dehydration. - CBC with Differential/Platelet - Comprehensive metabolic panel - TSH

## 2019-12-28 DIAGNOSIS — I639 Cerebral infarction, unspecified: Secondary | ICD-10-CM | POA: Diagnosis not present

## 2019-12-28 DIAGNOSIS — H905 Unspecified sensorineural hearing loss: Secondary | ICD-10-CM | POA: Diagnosis not present

## 2019-12-28 DIAGNOSIS — R5383 Other fatigue: Secondary | ICD-10-CM | POA: Diagnosis not present

## 2019-12-28 DIAGNOSIS — R634 Abnormal weight loss: Secondary | ICD-10-CM | POA: Diagnosis not present

## 2019-12-29 LAB — COMPREHENSIVE METABOLIC PANEL
ALT: 13 IU/L (ref 0–32)
AST: 23 IU/L (ref 0–40)
Albumin/Globulin Ratio: 1.8 (ref 1.2–2.2)
Albumin: 4 g/dL (ref 3.5–4.6)
Alkaline Phosphatase: 76 IU/L (ref 39–117)
BUN/Creatinine Ratio: 23 (ref 12–28)
BUN: 23 mg/dL (ref 10–36)
Bilirubin Total: 0.9 mg/dL (ref 0.0–1.2)
CO2: 22 mmol/L (ref 20–29)
Calcium: 9 mg/dL (ref 8.7–10.3)
Chloride: 105 mmol/L (ref 96–106)
Creatinine, Ser: 1.01 mg/dL — ABNORMAL HIGH (ref 0.57–1.00)
GFR calc Af Amer: 55 mL/min/{1.73_m2} — ABNORMAL LOW (ref >59)
GFR calc non Af Amer: 48 mL/min/{1.73_m2} — ABNORMAL LOW (ref >59)
Globulin, Total: 2.2 g/dL (ref 1.5–4.5)
Glucose: 87 mg/dL (ref 65–99)
Potassium: 4.5 mmol/L (ref 3.5–5.2)
Sodium: 143 mmol/L (ref 134–144)
Total Protein: 6.2 g/dL (ref 6.0–8.5)

## 2019-12-29 LAB — LIPID PANEL WITH LDL/HDL RATIO
Cholesterol, Total: 119 mg/dL (ref 100–199)
HDL: 66 mg/dL (ref >39)
LDL Chol Calc (NIH): 39 mg/dL (ref 0–99)
LDL/HDL Ratio: 0.6 ratio (ref 0.0–3.2)
Triglycerides: 63 mg/dL (ref 0–149)
VLDL Cholesterol Cal: 14 mg/dL (ref 5–40)

## 2019-12-29 LAB — CBC WITH DIFFERENTIAL/PLATELET
Basophils Absolute: 0 10*3/uL (ref 0.0–0.2)
Basos: 1 %
EOS (ABSOLUTE): 0.3 10*3/uL (ref 0.0–0.4)
Eos: 5 %
Hematocrit: 42.7 % (ref 34.0–46.6)
Hemoglobin: 14.1 g/dL (ref 11.1–15.9)
Immature Grans (Abs): 0 10*3/uL (ref 0.0–0.1)
Immature Granulocytes: 0 %
Lymphocytes Absolute: 2.6 10*3/uL (ref 0.7–3.1)
Lymphs: 42 %
MCH: 31.5 pg (ref 26.6–33.0)
MCHC: 33 g/dL (ref 31.5–35.7)
MCV: 96 fL (ref 79–97)
Monocytes Absolute: 0.5 10*3/uL (ref 0.1–0.9)
Monocytes: 7 %
Neutrophils Absolute: 2.8 10*3/uL (ref 1.4–7.0)
Neutrophils: 45 %
Platelets: 125 10*3/uL — ABNORMAL LOW (ref 150–450)
RBC: 4.47 x10E6/uL (ref 3.77–5.28)
RDW: 12.7 % (ref 11.7–15.4)
WBC: 6.2 10*3/uL (ref 3.4–10.8)

## 2019-12-29 LAB — TSH: TSH: 4.19 u[IU]/mL (ref 0.450–4.500)

## 2019-12-30 ENCOUNTER — Telehealth: Payer: Self-pay

## 2019-12-30 NOTE — Telephone Encounter (Signed)
-----   Message from Margo Common, [place] sent at 12/29/2019  5:22 PM EDT ----- Mild platelet and kidney changes. Indicates need to drink extra water to rehydrate. Recheck labs in 4-6 weeks to assess progress.

## 2019-12-30 NOTE — Telephone Encounter (Signed)
Patient advised and scheduled for 4 week f/u.

## 2020-01-24 NOTE — Progress Notes (Signed)
Established patient visit  I,April Miller,acting as a scribe for [employer], PA.,have documented all relevant documentation on the behalf of [employer], PA,as directed by  [employer], PA while in the presence of [employer], [place].   Patient: Margaret Hall   DOB: [birth year]   86 y.o. Female  MRN: [id number] Visit Date: 01/27/2020  Today's healthcare provider: [name], PA   Chief Complaint  Patient presents with  . Follow-up    Labs   Subjective    HPI  The patient is a 86 year old female who was last seen on 12/26/19 for her annual exam.  Her labs were obtained and revealed abnormal kidney function and low platelet count.  Patient returns today for follow up  Abnormal kidney function Lab Results  Component Value Date   CREATININE 1.01 (H) 12/28/2019   BUN 23 12/28/2019   NA 143 12/28/2019   K 4.5 12/28/2019   CL 105 12/28/2019   CO2 22 12/28/2019   Thrombocytopenia Lab Results  Component Value Date   PLT 125 (L) 12/28/2019      Patient Active Problem List   Diagnosis Date Noted  . Chronic obstructive pulmonary disease with acute exacerbation ([place]) 11/12/2017  . Age-related macular degeneration 07/29/2016  . Difficulty hearing 08/06/2015  . Deep vein thrombosis (DVT), postpartum 08/06/2015  . Thrombophlebitis of deep veins of lower extremity ([place]) 08/06/2015  . Syncope and collapse 08/06/2015  . H/O malignant neoplasm of breast 12/03/2009   Past Medical History:  Diagnosis Date  . Cancer ([place])    skin cancer- removed 10/06/16  . Stroke [facility])    Past Surgical History:  Procedure Laterality Date  . APPENDECTOMY  1965  . BREAST BIOPSY Right 2002  . BREAST LUMPECTOMY Right 2002  . HERNIA REPAIR  1974  . TUBAL LIGATION  1965   Social History   Tobacco Use  . Smoking status: Never Smoker  . Smokeless tobacco: Never Used  Substance Use Topics  . Alcohol use: No    Alcohol/week: 0.0 standard drinks  . Drug use: No    Allergies  Allergen Reactions  . Sulfa Antibiotics Nausea Only  . Sulfasalazine Nausea Only       Medications: Outpatient Medications Prior to Visit  Medication Sig  . acetaminophen (TYLENOL) 500 MG tablet Take 500 mg every 6 (six) hours as needed by mouth.  Marland Kitchen apixaban (ELIQUIS) 2.5 MG TABS tablet Take 2.5 mg by mouth 2 (two) times daily.   Marland Kitchen atorvastatin (LIPITOR) 20 MG tablet Take 20 mg by mouth daily at 6 PM.   . BISMUTH SUBSALICYLATE PO as needed.   . Multiple Vitamins-Minerals (EYE VITAMINS PO) Take by mouth 2 (two) times daily.    No facility-administered medications prior to visit.    Review of Systems  Constitutional: Negative for appetite change, chills, fatigue and fever.  Respiratory: Negative for chest tightness and shortness of breath.   Cardiovascular: Negative for chest pain and palpitations.  Gastrointestinal: Negative for abdominal pain, nausea and vomiting.  Neurological: Negative for dizziness and weakness.  Hematological: Negative.        Objective    BP 132/85 (BP Location: Left Arm, Patient Position: Sitting, Cuff Size: Large)   Pulse 80   Temp (!) 96.9 F (36.1 C) (Other (Comment))   Resp 18   Ht 5\' 1"  (1.549 m)   Wt 114 lb 6.4 oz (51.9 kg)   SpO2 98%   BMI 21.62 kg/m    Physical  Exam Vitals and nursing note reviewed. Exam conducted with a chaperone present.  Constitutional:      Appearance: Normal appearance. She is normal weight.  HENT:     Right Ear: Tympanic membrane, ear canal and external ear normal. There is no impacted cerumen.     Left Ear: Tympanic membrane, ear canal and external ear normal. There is no impacted cerumen.     Nose: Nose normal.     Mouth/Throat:     Mouth: Mucous membranes are moist.     Pharynx: Oropharynx is clear.  Eyes:     General: No scleral icterus.    Conjunctiva/sclera: Conjunctivae normal.     Pupils: Pupils are equal, round, and reactive to light.  Cardiovascular:     Rate and Rhythm: Normal  rate and regular rhythm.     Pulses: Normal pulses.     Heart sounds: Normal heart sounds.  Pulmonary:     Effort: Pulmonary effort is normal.     Breath sounds: Normal breath sounds.  Abdominal:     General: Bowel sounds are normal.     Palpations: Abdomen is soft.  Musculoskeletal:        General: Normal range of motion.     Cervical back: Normal range of motion and neck supple.  Skin:    General: Skin is warm and dry.  Neurological:     Mental Status: She is alert.  Psychiatric:        Mood and Affect: Mood normal.        Behavior: Behavior normal.        Thought Content: Thought content normal.        Judgment: Judgment normal.      Assessment & Plan    1. Elevated serum creatinine Creatinine slightly elevated and GFR 48 on 12-28-19. Has been drinking more water in diet and feels well today. Working in her flowers now. Recheck labs to assess progress. - CBC w/Diff/Platelet - Basic Metabolic Panel (BMET)  2. Thrombocytopenia (HCC) No significant bruising, epistaxis, hematemesis, hemoptysis, hematuria or melena. No abdominal pain of significance. Recheck CBC and BMP. - CBC w/Diff/Platelet - Basic Metabolic Panel (BMET)   Return Follow up will be based upon lab results.      Andres Shad, PA, have reviewed all documentation for this visit. The documentation on 01/27/20 for the exam, diagnosis, procedures, and orders are all accurate and complete.    [name], [place] [phone] (phone) [phone] (fax)  [place]

## 2020-01-27 ENCOUNTER — Ambulatory Visit (INDEPENDENT_AMBULATORY_CARE_PROVIDER_SITE_OTHER): Payer: Medicare Other | Admitting: Family Medicine

## 2020-01-27 ENCOUNTER — Other Ambulatory Visit: Payer: Self-pay

## 2020-01-27 ENCOUNTER — Encounter: Payer: Self-pay | Admitting: Family Medicine

## 2020-01-27 ENCOUNTER — Ambulatory Visit: Payer: Self-pay | Admitting: Family Medicine

## 2020-01-27 VITALS — BP 132/85 | HR 80 | Temp 96.9°F | Resp 18 | Ht 61.0 in | Wt 114.4 lb

## 2020-01-27 DIAGNOSIS — R7989 Other specified abnormal findings of blood chemistry: Secondary | ICD-10-CM | POA: Diagnosis not present

## 2020-01-27 DIAGNOSIS — D696 Thrombocytopenia, unspecified: Secondary | ICD-10-CM | POA: Diagnosis not present

## 2020-01-28 LAB — CBC WITH DIFFERENTIAL/PLATELET
Basophils Absolute: 0 10*3/uL (ref 0.0–0.2)
Basos: 0 %
EOS (ABSOLUTE): 0.2 10*3/uL (ref 0.0–0.4)
Eos: 3 %
Hematocrit: 41.2 % (ref 34.0–46.6)
Hemoglobin: 14.1 g/dL (ref 11.1–15.9)
Immature Grans (Abs): 0 10*3/uL (ref 0.0–0.1)
Immature Granulocytes: 0 %
Lymphocytes Absolute: 2 10*3/uL (ref 0.7–3.1)
Lymphs: 35 %
MCH: 32.3 pg (ref 26.6–33.0)
MCHC: 34.2 g/dL (ref 31.5–35.7)
MCV: 94 fL (ref 79–97)
Monocytes Absolute: 0.5 10*3/uL (ref 0.1–0.9)
Monocytes: 8 %
Neutrophils Absolute: 3 10*3/uL (ref 1.4–7.0)
Neutrophils: 54 %
Platelets: 136 10*3/uL — ABNORMAL LOW (ref 150–450)
RBC: 4.37 x10E6/uL (ref 3.77–5.28)
RDW: 12.2 % (ref 11.7–15.4)
WBC: 5.6 10*3/uL (ref 3.4–10.8)

## 2020-01-28 LAB — BASIC METABOLIC PANEL
BUN/Creatinine Ratio: 14 (ref 12–28)
BUN: 15 mg/dL (ref 10–36)
CO2: 22 mmol/L (ref 20–29)
Calcium: 9.1 mg/dL (ref 8.7–10.3)
Chloride: 108 mmol/L — ABNORMAL HIGH (ref 96–106)
Creatinine, Ser: 1.05 mg/dL — ABNORMAL HIGH (ref 0.57–1.00)
GFR calc Af Amer: 53 mL/min/{1.73_m2} — ABNORMAL LOW (ref >59)
GFR calc non Af Amer: 46 mL/min/{1.73_m2} — ABNORMAL LOW (ref >59)
Glucose: 91 mg/dL (ref 65–99)
Potassium: 4.2 mmol/L (ref 3.5–5.2)
Sodium: 145 mmol/L — ABNORMAL HIGH (ref 134–144)

## 2020-02-14 DIAGNOSIS — I725 Aneurysm of other precerebral arteries: Secondary | ICD-10-CM | POA: Diagnosis not present

## 2020-02-14 DIAGNOSIS — Z85828 Personal history of other malignant neoplasm of skin: Secondary | ICD-10-CM | POA: Diagnosis not present

## 2020-02-14 DIAGNOSIS — Z8673 Personal history of transient ischemic attack (TIA), and cerebral infarction without residual deficits: Secondary | ICD-10-CM | POA: Diagnosis not present

## 2020-02-14 DIAGNOSIS — Z7901 Long term (current) use of anticoagulants: Secondary | ICD-10-CM | POA: Diagnosis not present

## 2020-02-14 DIAGNOSIS — I4891 Unspecified atrial fibrillation: Secondary | ICD-10-CM | POA: Diagnosis not present

## 2020-02-14 DIAGNOSIS — Z79899 Other long term (current) drug therapy: Secondary | ICD-10-CM | POA: Diagnosis not present

## 2020-02-14 DIAGNOSIS — I671 Cerebral aneurysm, nonruptured: Secondary | ICD-10-CM | POA: Diagnosis not present

## 2020-03-08 ENCOUNTER — Other Ambulatory Visit: Payer: Self-pay

## 2020-03-08 ENCOUNTER — Encounter: Payer: Self-pay | Admitting: Family Medicine

## 2020-03-08 ENCOUNTER — Ambulatory Visit (INDEPENDENT_AMBULATORY_CARE_PROVIDER_SITE_OTHER): Payer: Medicare Other | Admitting: Family Medicine

## 2020-03-08 VITALS — BP 94/54 | HR 57 | Temp 96.9°F | Wt 113.0 lb

## 2020-03-08 DIAGNOSIS — S70362A Insect bite (nonvenomous), left thigh, initial encounter: Secondary | ICD-10-CM | POA: Diagnosis not present

## 2020-03-08 DIAGNOSIS — W57XXXA Bitten or stung by nonvenomous insect and other nonvenomous arthropods, initial encounter: Secondary | ICD-10-CM

## 2020-03-08 DIAGNOSIS — L02439 Carbuncle of limb, unspecified: Secondary | ICD-10-CM | POA: Diagnosis not present

## 2020-03-08 MED ORDER — DOXYCYCLINE HYCLATE 100 MG PO TABS: 100.0000 mg | ORAL_TABLET | Freq: Two times a day (BID) | ORAL | 0 refills | Status: DC

## 2020-03-08 NOTE — Progress Notes (Signed)
Established patient visit   Patient: Margaret Hall   DOB: January 24, 1925   86 y.o. Female  MRN: [id number] Visit Date: 03/08/2020  Today's healthcare provider: [name], PA   Chief Complaint  Patient presents with  . Tick Bite   I,Latasha Walston,acting as a Education administrator for [employer].,have documented all relevant documentation on the behalf of [employer],as directed by  [employer] while in the presence of [employer], [place].  Subjective    HPI Patient presents today C/O a tick bite on left upper thigh. She reports removing the tick on Tuesday the area is red and itching. She also has a second red area above the tick bite that appeared after removing tick on Tuesday. That area is also red and itchy. Patient has been using Peroxide with mild relief.   Past Medical History:  Diagnosis Date  . Cancer ([place])    skin cancer- removed 10/06/16  . Stroke [facility])    Social History   Tobacco Use  . Smoking status: Never Smoker  . Smokeless tobacco: Never Used  Vaping Use  . Vaping Use: Never used  Substance Use Topics  . Alcohol use: No    Alcohol/week: 0.0 standard drinks  . Drug use: No   Allergies  Allergen Reactions  . Sulfa Antibiotics Nausea Only  . Sulfasalazine Nausea Only   Medications: Outpatient Medications Prior to Visit  Medication Sig  . acetaminophen (TYLENOL) 500 MG tablet Take 500 mg every 6 (six) hours as needed by mouth.  Marland Kitchen apixaban (ELIQUIS) 2.5 MG TABS tablet Take 2.5 mg by mouth 2 (two) times daily.   Marland Kitchen atorvastatin (LIPITOR) 20 MG tablet Take 20 mg by mouth daily at 6 PM.   . BISMUTH SUBSALICYLATE PO as needed.   . Multiple Vitamins-Minerals (EYE VITAMINS PO) Take by mouth 2 (two) times daily.    No facility-administered medications prior to visit.    Review of Systems  Constitutional: Negative.   Respiratory: Negative.   Cardiovascular: Negative.   Musculoskeletal: Negative.       Objective    BP (!) 94/54  (BP Location: Left Arm, Patient Position: Sitting, Cuff Size: Normal)   Pulse (!) 57   Temp (!) 96.9 F (36.1 C) (Temporal)   Wt 113 lb (51.3 kg)   BMI 21.35 kg/m    Physical Exam   General: Appearance:    Well developed, well nourished female in no acute distress  Eyes:    PERRL, conjunctiva/corneas clear, EOM's intact       Lungs:     Clear to auscultation bilaterally, respirations unlabored  Heart:    Bradycardic. Irregularly irregular rhythm. No murmurs, rubs, or gallops.   MS:   All extremities are intact.   Neurologic:   Awake, alert, oriented x 3. No apparent focal neurological           defect.         No results found for any visits on 03/08/20.  Assessment & Plan    1. Tick bite of left thigh, initial encounter Pulled a tick off the left anterior thigh 2-3 days ago after work in her yard. Some redness around the bite sight but no fever. Will start antibiotic treatment and recheck if no better in 7-10 days. - doxycycline (VIBRA-TABS) 100 MG tablet; Take 1 tablet (100 mg total) by mouth 2 (two) times daily.  Dispense: 20 tablet; Refill: 0  2. Carbuncle of thigh 1 X 2 cm induration  with redness and pustules in center. Try warm compresses of Epsom salt water. Add antibiotic and recheck in 7-10 days. States this is a little better than at the onset 2-3 days ago. No fever, drainage or local lymphadenopathy.    - doxycycline (VIBRA-TABS) 100 MG tablet; Take 1 tablet (100 mg total) by mouth 2 (two) times daily.  Dispense: 20 tablet; Refill: 0   Return if symptoms worsen or fail to improve.         [name], [place] [phone] (phone) [phone] (fax)  [place]

## 2020-03-08 NOTE — Patient Instructions (Signed)

## 2020-07-17 DIAGNOSIS — Z872 Personal history of diseases of the skin and subcutaneous tissue: Secondary | ICD-10-CM | POA: Diagnosis not present

## 2020-07-17 DIAGNOSIS — L718 Other rosacea: Secondary | ICD-10-CM | POA: Diagnosis not present

## 2020-07-17 DIAGNOSIS — L570 Actinic keratosis: Secondary | ICD-10-CM | POA: Diagnosis not present

## 2020-07-17 DIAGNOSIS — L821 Other seborrheic keratosis: Secondary | ICD-10-CM | POA: Diagnosis not present

## 2020-07-17 DIAGNOSIS — L578 Other skin changes due to chronic exposure to nonionizing radiation: Secondary | ICD-10-CM | POA: Diagnosis not present

## 2020-10-17 ENCOUNTER — Ambulatory Visit (INDEPENDENT_AMBULATORY_CARE_PROVIDER_SITE_OTHER): Payer: Medicare Other

## 2020-10-17 ENCOUNTER — Other Ambulatory Visit: Payer: Self-pay

## 2020-10-17 DIAGNOSIS — Z23 Encounter for immunization: Secondary | ICD-10-CM

## 2020-10-23 DIAGNOSIS — Z882 Allergy status to sulfonamides status: Secondary | ICD-10-CM | POA: Diagnosis not present

## 2020-10-23 DIAGNOSIS — E785 Hyperlipidemia, unspecified: Secondary | ICD-10-CM | POA: Diagnosis not present

## 2020-10-23 DIAGNOSIS — J449 Chronic obstructive pulmonary disease, unspecified: Secondary | ICD-10-CM | POA: Diagnosis not present

## 2020-10-23 DIAGNOSIS — I4891 Unspecified atrial fibrillation: Secondary | ICD-10-CM | POA: Diagnosis not present

## 2020-10-23 DIAGNOSIS — Z7901 Long term (current) use of anticoagulants: Secondary | ICD-10-CM | POA: Diagnosis not present

## 2020-10-23 DIAGNOSIS — Z853 Personal history of malignant neoplasm of breast: Secondary | ICD-10-CM | POA: Diagnosis not present

## 2020-10-23 DIAGNOSIS — I5020 Unspecified systolic (congestive) heart failure: Secondary | ICD-10-CM | POA: Diagnosis not present

## 2020-10-23 DIAGNOSIS — I313 Pericardial effusion (noninflammatory): Secondary | ICD-10-CM | POA: Diagnosis not present

## 2020-10-23 DIAGNOSIS — I725 Aneurysm of other precerebral arteries: Secondary | ICD-10-CM | POA: Diagnosis not present

## 2020-10-23 DIAGNOSIS — I425 Other restrictive cardiomyopathy: Secondary | ICD-10-CM | POA: Diagnosis not present

## 2020-10-23 DIAGNOSIS — I5022 Chronic systolic (congestive) heart failure: Secondary | ICD-10-CM | POA: Diagnosis not present

## 2020-10-23 DIAGNOSIS — J90 Pleural effusion, not elsewhere classified: Secondary | ICD-10-CM | POA: Diagnosis not present

## 2020-10-23 DIAGNOSIS — I208 Other forms of angina pectoris: Secondary | ICD-10-CM | POA: Diagnosis not present

## 2020-10-23 DIAGNOSIS — G459 Transient cerebral ischemic attack, unspecified: Secondary | ICD-10-CM | POA: Diagnosis not present

## 2020-11-30 ENCOUNTER — Other Ambulatory Visit: Payer: Self-pay

## 2020-11-30 ENCOUNTER — Telehealth (INDEPENDENT_AMBULATORY_CARE_PROVIDER_SITE_OTHER): Payer: Medicare Other | Admitting: Adult Health

## 2020-11-30 ENCOUNTER — Encounter: Payer: Self-pay | Admitting: Adult Health

## 2020-11-30 DIAGNOSIS — J0100 Acute maxillary sinusitis, unspecified: Secondary | ICD-10-CM

## 2020-11-30 DIAGNOSIS — R059 Cough, unspecified: Secondary | ICD-10-CM

## 2020-11-30 MED ORDER — AMOXICILLIN 875 MG PO TABS: 875.0000 mg | ORAL_TABLET | Freq: Two times a day (BID) | ORAL | 0 refills | Status: DC

## 2020-11-30 MED ORDER — BENZONATATE 100 MG PO CAPS: 100.0000 mg | ORAL_CAPSULE | Freq: Two times a day (BID) | ORAL | 0 refills | Status: DC | PRN

## 2020-11-30 NOTE — Patient Instructions (Signed)
Amoxicillin; Clavulanic Acid Tablets What is this medicine? AMOXICILLIN; CLAVULANIC ACID (a mox i SIL in; KLAV yoo lan ic AS id) is a penicillin antibiotic. It treats some infections caused by bacteria. It will not work for colds, the flu, or other viruses. This medicine may be used for other purposes; ask your health care provider or pharmacist if you have questions. COMMON BRAND NAME(S): Augmentin What should I tell my health care provider before I take this medicine? They need to know if you have any of these conditions:  kidney disease  liver disease  mononucleosis  stomach or intestine problems such as colitis  an unusual or allergic reaction to amoxicillin, other penicillin or cephalosporin antibiotics, clavulanic acid, other medicines, foods, dyes, or preservatives  pregnant or trying to get pregnant  breast-feeding How should I use this medicine? Take this drug by mouth. Take it as directed on the prescription label at the same time every day. Take it with food at the start of a meal or snack. Take all of this drug unless your health care provider tells you to stop it early. Keep taking it even if you think you are better. Talk to your health care provider about the use of this drug in children. While it may be prescribed for selected conditions, precautions do apply. Overdosage: If you think you have taken too much of this medicine contact a poison control center or emergency room at once. NOTE: This medicine is only for you. Do not share this medicine with others. What if I miss a dose? If you miss a dose, take it as soon as you can. If it is almost time for your next dose, take only that dose. Do not take double or extra doses. What may interact with this medicine?  allopurinol  anticoagulants  birth control pills  methotrexate  probenecid This list may not describe all possible interactions. Give your health care provider a list of all the medicines, herbs,  non-prescription drugs, or dietary supplements you use. Also tell them if you smoke, drink alcohol, or use illegal drugs. Some items may interact with your medicine. What should I watch for while using this medicine? Tell your health care provider if your symptoms do not start to get better or if they get worse. This medicine may cause serious skin reactions. They can happen weeks to months after starting the medicine. Contact your health care provider right away if you notice fevers or flu-like symptoms with a rash. The rash may be red or purple and then turn into blisters or peeling of the skin. Or, you might notice a red rash with swelling of the face, lips or lymph nodes in your neck or under your arms. Do not treat diarrhea with over the counter products. Contact your health care provider if you have diarrhea that lasts more than 2 days or if it is severe and watery. If you have diabetes, you may get a false-positive result for sugar in your urine. Check with your health care provider. Birth control may not work properly while you are taking this medicine. Talk to your health care provider about using an extra method of birth control. What side effects may I notice from receiving this medicine? Side effects that you should report to your doctor or health care provider as soon as possible:  allergic reactions (skin rash, itching or hives; swelling of the face, lips, or tongue)  bloody or watery diarrhea  dark urine  infection (fever, chills, cough, sore   throat, or pain)  kidney injury (trouble passing urine or change in the amount of urine)  redness, blistering, peeling, or loosening of the skin, including inside the mouth  seizures  thrush (white patches in the mouth or mouth sores)  trouble breathing  unusual bruising or bleeding  unusually weak or tired Side effects that usually do not require medical attention (report to your doctor or health care provider if they continue or  are bothersome):  diarrhea  dizziness  headache  nausea, vomiting  unusual vaginal discharge, itching, or odor  upset stomach This list may not describe all possible side effects. Call your doctor for medical advice about side effects. You may report side effects to FDA at 1-800-FDA-1088. Where should I keep my medicine? Keep out of the reach of children and pets. Store at room temperature between 20 and 25 degrees C (68 and 77 degrees F). Throw away any unused drug after the expiration date. NOTE: This sheet is a summary. It may not cover all possible information. If you have questions about this medicine, talk to your doctor, pharmacist, or health care provider.  2021 Elsevier/Gold Standard (2020-08-08 09:45:03) Sinusitis, Adult Sinusitis is soreness and swelling (inflammation) of your sinuses. Sinuses are hollow spaces in the bones around your face. They are located:  Around your eyes.  In the middle of your forehead.  Behind your nose.  In your cheekbones. Your sinuses and nasal passages are lined with a fluid called mucus. Mucus drains out of your sinuses. Swelling can trap mucus in your sinuses. This lets germs (bacteria, virus, or fungus) grow, which leads to infection. Most of the time, this condition is caused by a virus. What are the causes? This condition is caused by:  Allergies.  Asthma.  Germs.  Things that block your nose or sinuses.  Growths in the nose (nasal polyps).  Chemicals or irritants in the air.  Fungus (rare). What increases the risk? You are more likely to develop this condition if:  You have a weak body defense system (immune system).  You do a lot of swimming or diving.  You use nasal sprays too much.  You smoke. What are the signs or symptoms? The main symptoms of this condition are pain and a feeling of pressure around the sinuses. Other symptoms include:  Stuffy nose (congestion).  Runny nose (drainage).  Swelling and  warmth in the sinuses.  Headache.  Toothache.  A cough that may get worse at night.  Mucus that collects in the throat or the back of the nose (postnasal drip).  Being unable to smell and taste.  Being very tired (fatigue).  A fever.  Sore throat.  Bad breath. How is this diagnosed? This condition is diagnosed based on:  Your symptoms.  Your medical history.  A physical exam.  Tests to find out if your condition is short-term (acute) or long-term (chronic). Your doctor may: ? Check your nose for growths (polyps). ? Check your sinuses using a tool that has a light (endoscope). ? Check for allergies or germs. ? Do imaging tests, such as an MRI or CT scan. How is this treated? Treatment for this condition depends on the cause and whether it is short-term or long-term.  If caused by a virus, your symptoms should go away on their own within 10 days. You may be given medicines to relieve symptoms. They include: ? Medicines that shrink swollen tissue in the nose. ? Medicines that treat allergies (antihistamines). ? A spray that  treats swelling of the nostrils. ? Rinses that help get rid of thick mucus in your nose (nasal saline washes).  If caused by bacteria, your doctor may wait to see if you will get better without treatment. You may be given antibiotic medicine if you have: ? A very bad infection. ? A weak body defense system.  If caused by growths in the nose, you may need to have surgery. Follow these instructions at home: Medicines  Take, use, or apply over-the-counter and prescription medicines only as told by your doctor. These may include nasal sprays.  If you were prescribed an antibiotic medicine, take it as told by your doctor. Do not stop taking the antibiotic even if you start to feel better. Hydrate and humidify  Drink enough water to keep your pee (urine) pale yellow.  Use a cool mist humidifier to keep the humidity level in your home above  50%.  Breathe in steam for 10-15 minutes, 3-4 times a day, or as told by your doctor. You can do this in the bathroom while a hot shower is running.  Try not to spend time in cool or dry air.   Rest  Rest as much as you can.  Sleep with your head raised (elevated).  Make sure you get enough sleep each night. General instructions  Put a warm, moist washcloth on your face 3-4 times a day, or as often as told by your doctor. This will help with discomfort.  Wash your hands often with soap and water. If there is no soap and water, use hand sanitizer.  Do not smoke. Avoid being around people who are smoking (secondhand smoke).  Keep all follow-up visits as told by your doctor. This is important.   Contact a doctor if:  You have a fever.  Your symptoms get worse.  Your symptoms do not get better within 10 days. Get help right away if:  You have a very bad headache.  You cannot stop throwing up (vomiting).  You have very bad pain or swelling around your face or eyes.  You have trouble seeing.  You feel confused.  Your neck is stiff.  You have trouble breathing. Summary  Sinusitis is swelling of your sinuses. Sinuses are hollow spaces in the bones around your face.  This condition is caused by tissues in your nose that become inflamed or swollen. This traps germs. These can lead to infection.  If you were prescribed an antibiotic medicine, take it as told by your doctor. Do not stop taking it even if you start to feel better.  Keep all follow-up visits as told by your doctor. This is important. This information is not intended to replace advice given to you by your health care provider. Make sure you discuss any questions you have with your health care provider. Document Revised: 02/15/2018 Document Reviewed: 02/15/2018 Elsevier Patient Education  2021 [place]. Cough, Adult A cough helps to clear your throat and lungs. A cough may be a sign of an illness or  another medical condition. An acute cough may only last 2-3 weeks, while a chronic cough may last 8 or more weeks. Many things can cause a cough. They include:  Germs (viruses or bacteria) that attack the airway.  Breathing in things that bother (irritate) your lungs.  Allergies.  Asthma.  Mucus that runs down the back of your throat (postnasal drip).  Smoking.  Acid backing up from the stomach into the tube that moves food from the mouth  to the stomach (gastroesophageal reflux).  Some medicines.  Lung problems.  Other medical conditions, such as heart failure or a blood clot in the lung (pulmonary embolism). Follow these instructions at home: Medicines  Take over-the-counter and prescription medicines only as told by your doctor.  Talk with your doctor before you take medicines that stop a cough (cough suppressants). Lifestyle  Do not smoke, and try not to be around smoke. Do not use any products that contain nicotine or tobacco, such as cigarettes, e-cigarettes, and chewing tobacco. If you need help quitting, ask your doctor.  Drink enough fluid to keep your pee (urine) pale yellow.  Avoid caffeine.  Do not drink alcohol if your doctor tells you not to drink.   General instructions  Watch for any changes in your cough. Tell your doctor about them.  Always cover your mouth when you cough.  Stay away from things that make you cough, such as perfume, candles, campfire smoke, or cleaning products.  If the air is dry, use a cool mist vaporizer or humidifier in your home.  If your cough is worse at night, try using extra pillows to raise your head up higher while you sleep.  Rest as needed.  Keep all follow-up visits as told by your doctor. This is important.   Contact a doctor if:  You have new symptoms.  You cough up pus.  Your cough does not get better after 2-3 weeks, or your cough gets worse.  Cough medicine does not help your cough and you are not  sleeping well.  You have pain that gets worse or pain that is not helped with medicine.  You have a fever.  You are losing weight and you do not know why.  You have night sweats. Get help right away if:  You cough up blood.  You have trouble breathing.  Your heartbeat is very fast. These symptoms may be an emergency. Do not wait to see if the symptoms will go away. Get medical help right away. Call your local emergency services (911 in the [place] Do not drive yourself to the hospital. Summary  A cough helps to clear your throat and lungs. Many things can cause a cough.  Take over-the-counter and prescription medicines only as told by your doctor.  Always cover your mouth when you cough.  Contact a doctor if you have new symptoms or you have a cough that does not get better or gets worse. This information is not intended to replace advice given to you by your health care provider. Make sure you discuss any questions you have with your health care provider. Document Revised: 11/04/2019 Document Reviewed: 10/04/2018 Elsevier Patient Education  [place].

## 2020-11-30 NOTE — Progress Notes (Signed)
Virtual telephone visit    Virtual Visit via Telephone Note   This visit type was conducted due to national recommendations for restrictions regarding the COVID-19 Pandemic (e.g. social distancing) in an effort to limit this patient's exposure and mitigate transmission in our community. Due to her co-morbid illnesses, this patient is at least at moderate risk for complications without adequate follow up. This format is felt to be most appropriate for this patient at this time. The patient did not have access to video technology or had technical difficulties with video requiring transitioning to audio format only (telephone). Physical exam was limited to content and character of the telephone converstion.   Parties involved in visit as below:   Patient location: at home Provider location: Provider: Provider's office at  [facility], [place].     I discussed the limitations of evaluation and management by telemedicine and the availability of in person appointments. The patient expressed understanding and agreed to proceed.   Visit Date: 11/30/2020  Today's healthcare provider: Marcille Buffy, FNP   Chief Complaint  Patient presents with  . URI   Subjective    HPI HPI    URI    Associated symptoms inlclude congestion, cough, headache, sinus pain, sore throat and vomiting .  Recent episode started in the past 7 days.  The temperature has been with in normal range.  Patient  is drinking plenty of fluids.       Last edited by Minette Headland, CMA on 11/30/2020  2:28 PM. (History)      Onset 11/24/20 and she has had cough, coughing up green mucous. Sinus pressure and congestion.  Denies any trouble breathing.  Denies any exposure to COVID 19 or any other illness. No one is ill in home.  Patient  denies any fever, body aches,chills, rash, chest pain, shortness of breath, nausea, vomiting, or diarrhea.  Denies dizziness, lightheadedness, pre syncopal  or syncopal episodes.  History of COPD.  Denies any other concerns.    Past Medical History:  Diagnosis Date  . Cancer ([place])    skin cancer- removed 10/06/16  . Stroke [facility])    Past Surgical History:  Procedure Laterality Date  . APPENDECTOMY  1965  . BREAST BIOPSY Right 2002  . BREAST LUMPECTOMY Right 2002  . HERNIA REPAIR  1974  . TUBAL LIGATION  1965   Social History   Tobacco Use  . Smoking status: Never Smoker  . Smokeless tobacco: Never Used  Vaping Use  . Vaping Use: Never used  Substance Use Topics  . Alcohol use: No    Alcohol/week: 0.0 standard drinks  . Drug use: No   Social History   Socioeconomic History  . Marital status: Widowed    Spouse name: Not on file  . Number of children: 5  . Years of education: Not on file  . Highest education level: Some college, no degree  Occupational History  . Occupation: retired  Tobacco Use  . Smoking status: Never Smoker  . Smokeless tobacco: Never Used  Vaping Use  . Vaping Use: Never used  Substance and Sexual Activity  . Alcohol use: No    Alcohol/week: 0.0 standard drinks  . Drug use: No  . Sexual activity: Never  Other Topics Concern  . Not on file  Social History Narrative  . Not on file   Social Determinants of Health   Financial Resource Strain: Low Risk   . Difficulty of Paying Living Expenses: Not  hard at all  Food Insecurity: No Food Insecurity  . Worried About Charity fundraiser in the Last Year: Never true  . Ran Out of Food in the Last Year: Never true  Transportation Needs: No Transportation Needs  . Lack of Transportation (Medical): No  . Lack of Transportation (Non-Medical): No  Physical Activity: Inactive  . Days of Exercise per Week: 0 days  . Minutes of Exercise per Session: 0 min  Stress: No Stress Concern Present  . Feeling of Stress : Not at all  Social Connections: Socially Isolated  . Frequency of Communication with Friends and Family: More than three times a week  .  Frequency of Social Gatherings with Friends and Family: More than three times a week  . Attends Religious Services: Never  . Active Member of Clubs or Organizations: No  . Attends Archivist Meetings: Never  . Marital Status: Widowed  Intimate Partner Violence: Not At Risk  . Fear of Current or Ex-Partner: No  . Emotionally Abused: No  . Physically Abused: No  . Sexually Abused: No   Family Status  Relation Name Status  . Mother  Deceased at age 30       intestinal blockage  . Father  Deceased at age 82       cerebral hemorrhage  . Brother 1 Deceased  . Brother 2 Deceased at age 52  . Sister  (Not Specified)   Family History  Problem Relation Age of Onset  . Melanoma Brother   . Stroke Brother   . Heart attack Brother   . Heart disease Sister    Allergies  Allergen Reactions  . Sulfa Antibiotics Nausea Only  . Sulfasalazine Nausea Only      Medications: Outpatient Medications Prior to Visit  Medication Sig  . acetaminophen (TYLENOL) 500 MG tablet Take 500 mg every 6 (six) hours as needed by mouth.  Marland Kitchen apixaban (ELIQUIS) 2.5 MG TABS tablet Take 2.5 mg by mouth 2 (two) times daily.   Marland Kitchen atorvastatin (LIPITOR) 20 MG tablet Take 20 mg by mouth daily at 6 PM.   . BISMUTH SUBSALICYLATE PO as needed.   . Multiple Vitamins-Minerals (EYE VITAMINS PO) Take by mouth 2 (two) times daily.   . [DISCONTINUED] doxycycline (VIBRA-TABS) 100 MG tablet Take 1 tablet (100 mg total) by mouth 2 (two) times daily.   No facility-administered medications prior to visit.    Review of Systems  Last CBC Lab Results  Component Value Date   WBC 5.6 01/27/2020   HGB 14.1 01/27/2020   HCT 41.2 01/27/2020   MCV 94 01/27/2020   MCH 32.3 01/27/2020   RDW 12.2 01/27/2020   PLT 136 (L) 60/45/4098   Last metabolic panel Lab Results  Component Value Date   GLUCOSE 91 01/27/2020   NA 145 (H) 01/27/2020   K 4.2 01/27/2020   CL 108 (H) 01/27/2020   CO2 22 01/27/2020   BUN 15  01/27/2020   CREATININE 1.05 (H) 01/27/2020   GFRNONAA 46 (L) 01/27/2020   GFRAA 53 (L) 01/27/2020   CALCIUM 9.1 01/27/2020   PROT 6.2 12/28/2019   ALBUMIN 4.0 12/28/2019   LABGLOB 2.2 12/28/2019   AGRATIO 1.8 12/28/2019   BILITOT 0.9 12/28/2019   ALKPHOS 76 12/28/2019   AST 23 12/28/2019   ALT 13 12/28/2019   Last lipids Lab Results  Component Value Date   CHOL 119 12/28/2019   HDL 66 12/28/2019   LDLCALC 39 12/28/2019   TRIG 63  12/28/2019   Last hemoglobin A1c No results found for: HGBA1C Last thyroid functions Lab Results  Component Value Date   TSH 4.190 12/28/2019   Last vitamin D No results found for: 25OHVITD2, 25OHVITD3, VD25OH Last vitamin B12 and Folate No results found for: VITAMINB12, FOLATE    Objective    There were no vitals taken for this visit. BP Readings from Last 3 Encounters:  03/08/20 (!) 94/54  01/27/20 132/85  12/26/19 118/72   Wt Readings from Last 3 Encounters:  03/08/20 113 lb (51.3 kg)  01/27/20 114 lb 6.4 oz (51.9 kg)  12/26/19 112 lb (50.8 kg)      Patient is alert and oriented and responsive to questions Engages in conversation with provider. Speaks in full sentences without any pauses without any shortness of breath or distress.    Assessment & Plan     Diagnoses of Acute non-recurrent maxillary sinusitis and Cough were pertinent to this visit.  Meds ordered this encounter  Medications  . amoxicillin (AMOXIL) 875 MG tablet    Sig: Take 1 tablet (875 mg total) by mouth 2 (two) times daily.    Dispense:  20 tablet    Refill:  0  . benzonatate (TESSALON) 100 MG capsule    Sig: Take 1 capsule (100 mg total) by mouth 2 (two) times daily as needed for cough.    Dispense:  20 capsule    Refill:  0    Orders Placed This Encounter  Procedures  . COVID-19, Flu A+B and RSV  . DG Chest 2 View  . CBC with Differential/Platelet  . Comprehensive Metabolic Panel (CMET)   Return in about 3 days (around 12/03/2020), or if symptoms  worsen or fail to improve, for at any time for any worsening symptoms, Go to Emergency room/ urgent care if worse.    I discussed the assessment and treatment plan with the patient. The patient was provided an opportunity to ask questions and all were answered. The patient agreed with the plan and demonstrated an understanding of the instructions.   I discussed the limitations of evaluation and management by telemedicine and the availability of in person appointments. The patient expressed understanding and agreed to proceed.  The patient was advised to call back or seek an in-person evaluation if the symptoms worsen or if the condition fails to improve as anticipated.  I provided 30  minutes of non-face-to-face time during this encounter.  The entirety of the information documented in the History of Present Illness, Review of Systems and Physical Exam were personally obtained by me. Portions of this information were initially documented by the CMA and reviewed by me for thoroughness and accuracy.    Marcille Buffy, [place] [phone] (phone) [phone] (fax)  [place]

## 2020-12-05 ENCOUNTER — Telehealth: Payer: Self-pay

## 2020-12-05 NOTE — Telephone Encounter (Signed)
Copied from [place] [id number]. Topic: General - Other >> Dec 05, 2020  8:37 AM Keene Breath wrote: Reason for CRM: Patient called to cancel her Medicare well visit on 3/16.  She has a memorial service at the same time and will not be able to keep the appt.  Please call patient to reschedule.  CB# [phone]

## 2020-12-11 NOTE — Progress Notes (Signed)
This encounter was created in error - please disregard.

## 2020-12-12 ENCOUNTER — Other Ambulatory Visit: Payer: Self-pay

## 2020-12-18 NOTE — Progress Notes (Addendum)
Subjective:   Margaret Hall is a 87 y.o. female who presents for Medicare Annual (Subsequent) preventive examination.  I connected with Margaret Hall today by telephone and verified that I am speaking with the correct person using two identifiers. Location patient: home Location provider: work Persons participating in the virtual visit: patient, provider.   I discussed the limitations, risks, security and privacy concerns of performing an evaluation and management service by telephone and the availability of in person appointments. I also discussed with the patient that there may be a patient responsible charge related to this service. The patient expressed understanding and verbally consented to this telephonic visit.    Interactive audio and video telecommunications were attempted between this provider and patient, however failed, due to patient having technical difficulties OR patient did not have access to video capability.  We continued and completed visit with audio only.   Review of Systems    N/A  Cardiac Risk Factors include: advanced age (>58men, >33 women);dyslipidemia     Objective:    There were no vitals filed for this visit. There is no height or weight on file to calculate BMI.  Advanced Directives 12/19/2020 12/07/2019 08/16/2018 08/04/2017  Does Patient Have a Medical Advance Directive? Yes Yes Yes Yes  Type of Paramedic of [place];Living will [place];Living will [place];Living will [place];Living will  Copy of [place] in Chart? Yes - validated most recent copy scanned in chart (See row information) Yes - validated most recent copy scanned in chart (See row information) Yes - validated most recent copy scanned in chart (See row information) No - copy requested    Current Medications (verified) Outpatient Encounter Medications as of 12/19/2020   Medication Sig   acetaminophen (TYLENOL) 500 MG tablet Take 500 mg every 6 (six) hours as needed by mouth.   apixaban (ELIQUIS) 2.5 MG TABS tablet Take 2.5 mg by mouth 2 (two) times daily.    atorvastatin (LIPITOR) 20 MG tablet Take 20 mg by mouth daily at 6 PM.    BISMUTH SUBSALICYLATE PO as needed.    Multiple Vitamins-Minerals (PRESERVISION AREDS 2 PO) Take 1 capsule by mouth 2 (two) times daily.   amoxicillin (AMOXIL) 875 MG tablet Take 1 tablet (875 mg total) by mouth 2 (two) times daily. (Patient not taking: Reported on 12/19/2020)   benzonatate (TESSALON) 100 MG capsule Take 1 capsule (100 mg total) by mouth 2 (two) times daily as needed for cough. (Patient not taking: Reported on 12/19/2020)   Multiple Vitamins-Minerals (EYE VITAMINS PO) Take by mouth 2 (two) times daily.  (Patient not taking: Reported on 12/19/2020)   No facility-administered encounter medications on file as of 12/19/2020.    Allergies (verified) Sulfa antibiotics and Sulfasalazine   History: Past Medical History:  Diagnosis Date   Cancer ([place])    skin cancer- removed 10/06/16   Hyperlipidemia    Stroke [facility])    Past Surgical History:  Procedure Laterality Date   APPENDECTOMY  1965   BREAST BIOPSY Right 2002   BREAST LUMPECTOMY Right 2002   HERNIA REPAIR  1974   TUBAL LIGATION  1965   Family History  Problem Relation Age of Onset   Melanoma Brother    Stroke Brother    Heart attack Brother    Heart disease Sister    Social History   Socioeconomic History   Marital status: Widowed    Spouse name: Not on  file   Number of children: 5   Years of education: Not on file   Highest education level: Some college, no degree  Occupational History   Occupation: retired  Tobacco Use   Smoking status: Never Smoker   Smokeless tobacco: Never Used  Scientific laboratory technician Use: Never used  Substance and Sexual Activity   Alcohol use: No    Alcohol/week: 0.0 standard drinks   Drug use: No   Sexual activity:  Never  Other Topics Concern   Not on file  Social History Narrative   Not on file   Social Determinants of Health   Financial Resource Strain: Low Risk    Difficulty of Paying Living Expenses: Not hard at all  Food Insecurity: No Food Insecurity   Worried About Charity fundraiser in the Last Year: Never true   [place] in the Last Year: Never true  Transportation Needs: No Transportation Needs   Lack of Transportation (Medical): No   Lack of Transportation (Non-Medical): No  Physical Activity: Inactive   Days of Exercise per Week: 0 days   Minutes of Exercise per Session: 0 min  Stress: No Stress Concern Present   Feeling of Stress : Not at all  Social Connections: Moderately Isolated   Frequency of Communication with Friends and Family: More than three times a week   Frequency of Social Gatherings with Friends and Family: More than three times a week   Attends Religious Services: More than 4 times per year   Active Member of [employer] or Organizations: No   Attends Archivist Meetings: Never   Marital Status: Widowed    Tobacco Counseling Counseling given: Not Answered   Clinical Intake:  Pre-visit preparation completed: Yes  Pain : No/denies pain     Nutritional Risks: None Diabetes: No  How often do you need to have someone help you when you read instructions, pamphlets, or other written materials from your doctor or pharmacy?: 3 - Sometimes (Due to MD in both eyes.)  Diabetic? No  Interpreter Needed?: No  Information entered by :: [facility], LPN   Activities of Daily Living In your present state of health, do you have any difficulty performing the following activities: 12/19/2020  Hearing? N  Vision? N  Difficulty concentrating or making decisions? N  Walking or climbing stairs? N  Dressing or bathing? N  Doing errands, shopping? N  Preparing Food and eating ? N  Using the Toilet? N  In the past six months, have you accidently leaked  urine? N  Do you have problems with loss of bowel control? N  Managing your Medications? N  Managing your Finances? N  Housekeeping or managing your Housekeeping? N  Some recent data might be hidden    Patient Care Team: Chrismon, Vickki Muff, PA-C as PCP - General (Family Medicine) Dingeldein, Remo Lipps, MD as Consulting Physician (Ophthalmology) Precious Reel, MD as Referring Physician (Internal Medicine) Lawana Pai, MD (Inactive) as Consulting Physician (Neurology) Dwan Bolt, MD as Referring Physician (Cardiology) Throat, [place] [facility]  Indicate any recent Medical Services you may have received from other than [facility] providers in the past year (date may be approximate).     Assessment:   This is a routine wellness examination for [facility].  Hearing/Vision screen No exam data present  Dietary issues and exercise activities discussed: Current Exercise Habits: The patient does not participate in regular exercise at present, Exercise limited by: None identified  Goals  Increase water intake     Recommend increasing water intake to 6-8 8 oz glasses a day.          Depression Screen PHQ 2/9 Scores 12/19/2020 12/19/2020 12/07/2019 08/16/2018 08/16/2018 08/04/2017 07/29/2016  PHQ - 2 Score 1 0 0 0 0 0 0  PHQ- 9 Score - - - 0 - - -    Fall Risk Fall Risk  12/19/2020 12/07/2019 07/12/2019 08/16/2018 08/04/2017  Falls in the past year? 0 0 0 0 No  Number falls in past yr: 0 0 0 - -  Injury with Fall? 0 0 0 - -    FALL RISK PREVENTION PERTAINING TO THE HOME:  Any stairs in or around the home? Yes  If so, are there any without handrails? No  Home free of loose throw rugs in walkways, pet beds, electrical cords, etc? Yes  Adequate lighting in your home to reduce risk of falls? Yes   ASSISTIVE DEVICES UTILIZED TO PREVENT FALLS:  Life alert? Yes  Use of a cane, walker or w/c? No  Grab bars in the bathroom? Yes  Shower chair or bench in shower? Yes   Elevated toilet seat or a handicapped toilet? No    Cognitive Function: Normal cognitive status assessed by direct observation by this Nurse Health Advisor. No abnormalities found.         Immunizations Immunization History  Administered Date(s) Administered   Fluad Quad(high Dose 65+) 10/17/2020   Influenza, High Dose Seasonal PF 07/29/2016, 08/04/2017, 08/16/2018   PFIZER(Purple Top)SARS-COV-2 Vaccination 10/15/2019, 11/07/2019, 07/04/2020    TDAP status: Due, Education has been provided regarding the importance of this vaccine. Advised may receive this vaccine at local pharmacy or Health Dept. Aware to provide a copy of the vaccination record if obtained from local pharmacy or Health Dept. Verbalized acceptance and understanding.  Flu Vaccine status: Up to date  Pneumococcal vaccine status: Declined,  Education has been provided regarding the importance of this vaccine but patient still declined. Advised may receive this vaccine at local pharmacy or Health Dept. Aware to provide a copy of the vaccination record if obtained from local pharmacy or Health Dept. Verbalized acceptance and understanding.   Covid-19 vaccine status: Completed vaccines  Qualifies for Shingles Vaccine? Yes   Zostavax completed No   Shingrix Completed?: No.    Education has been provided regarding the importance of this vaccine. Patient has been advised to call insurance company to determine out of pocket expense if they have not yet received this vaccine. Advised may also receive vaccine at local pharmacy or Health Dept. Verbalized acceptance and understanding.  Screening Tests Health Maintenance  Topic Date Due   DEXA SCAN  Never done   PNA vac Low Risk Adult (1 of 2 - PCV13) 12/19/2021 (Originally 10/13/1989)   TETANUS/TDAP  08/24/2023 (Originally 10/12/1943)   INFLUENZA VACCINE  Completed   COVID-19 Vaccine  Completed   HPV VACCINES  Aged Out    Health Maintenance  Health Maintenance Due  Topic  Date Due   DEXA SCAN  Never done    Colorectal cancer screening: No longer required.   Mammogram status: No longer required due to age.  Bone Density status: Currently due, declined order today.   Lung Cancer Screening: (Low Dose CT Chest recommended if Age 56-80 years, 30 pack-year currently smoking OR have quit w/in 15years.) does not qualify.   Additional Screening:  Vision Screening: Recommended annual ophthalmology exams for early detection of glaucoma and other disorders of the  eye. Is the patient up to date with their annual eye exam?  Yes  Who is the provider or what is the name of the office in which the patient attends annual eye exams? Dr Sandra Cockayne If pt is not established with a provider, would they like to be referred to a provider to establish care? No .   Dental Screening: Recommended annual dental exams for proper oral hygiene  Community Resource Referral / Chronic Care Management: CRR required this visit?  No   CCM required this visit?  No      Plan:     I have personally reviewed and noted the following in the patient's chart:   Medical and social history Use of alcohol, tobacco or illicit drugs  Current medications and supplements Functional ability and status Nutritional status Physical activity Advanced directives List of other physicians Hospitalizations, surgeries, and ER visits in previous 12 months Vitals Screenings to include cognitive, depression, and falls Referrals and appointments  In addition, I have reviewed and discussed with patient certain preventive protocols, quality metrics, and best practice recommendations. A written personalized care plan for preventive services as well as general preventive health recommendations were provided to patient.     Boss Danielsen [place], [place]   1/44/3154   Nurse Notes: Pt declined receiving a future Pneumonia vaccine or a DEXA order today.   Reviewed screening note and plan of Nurse Health Advisor.  Agree with documentation and recommendations.

## 2020-12-19 ENCOUNTER — Ambulatory Visit (INDEPENDENT_AMBULATORY_CARE_PROVIDER_SITE_OTHER): Payer: Medicare Other

## 2020-12-19 ENCOUNTER — Other Ambulatory Visit: Payer: Self-pay

## 2020-12-19 DIAGNOSIS — Z0000 Encounter for general adult medical examination without abnormal findings: Secondary | ICD-10-CM

## 2020-12-19 NOTE — Patient Instructions (Signed)
Margaret Hall , Thank you for taking time to come for your Medicare Wellness Visit. I appreciate your ongoing commitment to your health goals. Please review the following plan we discussed and let me know if I can assist you in the future.   Screening recommendations/referrals: Colonoscopy: No longer required.  Mammogram: No longer required.  Bone Density: Currently due, declined order. Recommended yearly ophthalmology/optometry visit for glaucoma screening and checkup Recommended yearly dental visit for hygiene and checkup  Vaccinations: Influenza vaccine: Done 10/17/20 Pneumococcal vaccine: Currently due, declined receiving.  Tdap vaccine: Currently due, declined receiving.  Shingles vaccine: Shingrix discussed. Please contact your pharmacy for coverage information.     Advanced directives: Currently on file.  Conditions/risks identified: Recommend increasing water intake to 6-8 8 oz glasses a day.   Next appointment: None, declined scheduling a follow up with PCP or an AWV for 2023 at this time.    Preventive Care 13 Years and Older, Female Preventive care refers to lifestyle choices and visits with your health care provider that can promote health and wellness. What does preventive care include?  A yearly physical exam. This is also called an annual well check.  Dental exams once or twice a year.  Routine eye exams. Ask your health care provider how often you should have your eyes checked.  Personal lifestyle choices, including:  Daily care of your teeth and gums.  Regular physical activity.  Eating a healthy diet.  Avoiding tobacco and drug use.  Limiting alcohol use.  Practicing safe sex.  Taking low-dose aspirin every day.  Taking vitamin and mineral supplements as recommended by your health care provider. What happens during an annual well check? The services and screenings done by your health care provider during your annual well check will depend on your age,  overall health, lifestyle risk factors, and family history of disease. Counseling  Your health care provider may ask you questions about your:  Alcohol use.  Tobacco use.  Drug use.  Emotional well-being.  Home and relationship well-being.  Sexual activity.  Eating habits.  History of falls.  Memory and ability to understand (cognition).  Work and work Statistician.  Reproductive health. Screening  You may have the following tests or measurements:  Height, weight, and BMI.  Blood pressure.  Lipid and cholesterol levels. These may be checked every 5 years, or more frequently if you are over 73 years old.  Skin check.  Lung cancer screening. You may have this screening every year starting at age 56 if you have a 30-pack-year history of smoking and currently smoke or have quit within the past 15 years.  Fecal occult blood test (FOBT) of the stool. You may have this test every year starting at age 35.  Flexible sigmoidoscopy or colonoscopy. You may have a sigmoidoscopy every 5 years or a colonoscopy every 10 years starting at age 22.  Hepatitis C blood test.  Hepatitis B blood test.  Sexually transmitted disease (STD) testing.  Diabetes screening. This is done by checking your blood sugar (glucose) after you have not eaten for a while (fasting). You may have this done every 1-3 years.  Bone density scan. This is done to screen for osteoporosis. You may have this done starting at age 40.  Mammogram. This may be done every 1-2 years. Talk to your health care provider about how often you should have regular mammograms. Talk with your health care provider about your test results, treatment options, and if necessary, the need for more  tests. Vaccines  Your health care provider may recommend certain vaccines, such as:  Influenza vaccine. This is recommended every year.  Tetanus, diphtheria, and acellular pertussis (Tdap, Td) vaccine. You may need a Td booster every 10  years.  Zoster vaccine. You may need this after age 66.  Pneumococcal 13-valent conjugate (PCV13) vaccine. One dose is recommended after age 62.  Pneumococcal polysaccharide (PPSV23) vaccine. One dose is recommended after age 22. Talk to your health care provider about which screenings and vaccines you need and how often you need them. This information is not intended to replace advice given to you by your health care provider. Make sure you discuss any questions you have with your health care provider. Document Released: 10/12/2015 Document Revised: 06/04/2016 Document Reviewed: 07/17/2015 Elsevier Interactive Patient Education  2017 [place] Prevention in the Home Falls can cause injuries. They can happen to people of all ages. There are many things you can do to make your home safe and to help prevent falls. What can I do on the outside of my home?  Regularly fix the edges of walkways and driveways and fix any cracks.  Remove anything that might make you trip as you walk through a door, such as a raised step or threshold.  Trim any bushes or trees on the path to your home.  Use bright outdoor lighting.  Clear any walking paths of anything that might make someone trip, such as rocks or tools.  Regularly check to see if handrails are loose or broken. Make sure that both sides of any steps have handrails.  Any raised decks and porches should have guardrails on the edges.  Have any leaves, snow, or ice cleared regularly.  Use sand or salt on walking paths during winter.  Clean up any spills in your garage right away. This includes oil or grease spills. What can I do in the bathroom?  Use night lights.  Install grab bars by the toilet and in the tub and shower. Do not use towel bars as grab bars.  Use non-skid mats or decals in the tub or shower.  If you need to sit down in the shower, use a plastic, non-slip stool.  Keep the floor dry. Clean up any water that  spills on the floor as soon as it happens.  Remove soap buildup in the tub or shower regularly.  Attach bath mats securely with double-sided non-slip rug tape.  Do not have throw rugs and other things on the floor that can make you trip. What can I do in the bedroom?  Use night lights.  Make sure that you have a light by your bed that is easy to reach.  Do not use any sheets or blankets that are too big for your bed. They should not hang down onto the floor.  Have a firm chair that has side arms. You can use this for support while you get dressed.  Do not have throw rugs and other things on the floor that can make you trip. What can I do in the kitchen?  Clean up any spills right away.  Avoid walking on wet floors.  Keep items that you use a lot in easy-to-reach places.  If you need to reach something above you, use a strong step stool that has a grab bar.  Keep electrical cords out of the way.  Do not use floor polish or wax that makes floors slippery. If you must use wax, use non-skid floor  wax.  Do not have throw rugs and other things on the floor that can make you trip. What can I do with my stairs?  Do not leave any items on the stairs.  Make sure that there are handrails on both sides of the stairs and use them. Fix handrails that are broken or loose. Make sure that handrails are as long as the stairways.  Check any carpeting to make sure that it is firmly attached to the stairs. Fix any carpet that is loose or worn.  Avoid having throw rugs at the top or bottom of the stairs. If you do have throw rugs, attach them to the floor with carpet tape.  Make sure that you have a light switch at the top of the stairs and the bottom of the stairs. If you do not have them, ask someone to add them for you. What else can I do to help prevent falls?  Wear shoes that:  Do not have high heels.  Have rubber bottoms.  Are comfortable and fit you well.  Are closed at the  toe. Do not wear sandals.  If you use a stepladder:  Make sure that it is fully opened. Do not climb a closed stepladder.  Make sure that both sides of the stepladder are locked into place.  Ask someone to hold it for you, if possible.  Clearly mark and make sure that you can see:  Any grab bars or handrails.  First and last steps.  Where the edge of each step is.  Use tools that help you move around (mobility aids) if they are needed. These include:  Canes.  Walkers.  Scooters.  Crutches.  Turn on the lights when you go into a dark area. Replace any light bulbs as soon as they burn out.  Set up your furniture so you have a clear path. Avoid moving your furniture around.  If any of your floors are uneven, fix them.  If there are any pets around you, be aware of where they are.  Review your medicines with your doctor. Some medicines can make you feel dizzy. This can increase your chance of falling. Ask your doctor what other things that you can do to help prevent falls. This information is not intended to replace advice given to you by your health care provider. Make sure you discuss any questions you have with your health care provider. Document Released: 07/12/2009 Document Revised: 02/21/2016 Document Reviewed: 10/20/2014 Elsevier Interactive Patient Education  2017 [employer].

## 2021-01-08 ENCOUNTER — Telehealth: Payer: Self-pay

## 2021-01-08 DIAGNOSIS — R634 Abnormal weight loss: Secondary | ICD-10-CM

## 2021-01-08 NOTE — Telephone Encounter (Signed)
It is ok to place urgent referral as patient requested, however I have not seen this patient and if she is having any urgent issues I recommend she be  seen immediately.  Schedule office visit here if she will.

## 2021-01-08 NOTE — Telephone Encounter (Signed)
Copied from [place] [phone]. Topic: Referral - Request for Referral >> Jan 08, 2021 10:37 AM Pawlus, Brayton Layman A wrote: Pts daughter called asking if the pt could be referred to a Gastro specialist - Dr. Santa Lighter at [facility] - caller stated the pt has been having issues with her stomach and has been avoiding eating.

## 2021-01-08 NOTE — Telephone Encounter (Signed)
Please review chart and advise. KW 

## 2021-01-08 NOTE — Addendum Note (Signed)
Addended by: Minette Headland on: 01/08/2021 02:43 PM   Modules accepted: Orders

## 2021-03-27 DIAGNOSIS — R634 Abnormal weight loss: Secondary | ICD-10-CM | POA: Diagnosis not present

## 2021-03-27 DIAGNOSIS — R1010 Upper abdominal pain, unspecified: Secondary | ICD-10-CM | POA: Diagnosis not present

## 2021-04-23 DIAGNOSIS — K5750 Diverticulosis of both small and large intestine without perforation or abscess without bleeding: Secondary | ICD-10-CM | POA: Diagnosis not present

## 2021-04-23 DIAGNOSIS — I728 Aneurysm of other specified arteries: Secondary | ICD-10-CM | POA: Diagnosis not present

## 2021-04-23 DIAGNOSIS — R188 Other ascites: Secondary | ICD-10-CM | POA: Diagnosis not present

## 2021-05-05 DIAGNOSIS — Z7901 Long term (current) use of anticoagulants: Secondary | ICD-10-CM | POA: Diagnosis not present

## 2021-05-05 DIAGNOSIS — U071 COVID-19: Secondary | ICD-10-CM | POA: Diagnosis not present

## 2021-05-05 DIAGNOSIS — R0989 Other specified symptoms and signs involving the circulatory and respiratory systems: Secondary | ICD-10-CM | POA: Diagnosis not present

## 2021-05-05 DIAGNOSIS — R0789 Other chest pain: Secondary | ICD-10-CM | POA: Diagnosis not present

## 2021-05-05 DIAGNOSIS — Z882 Allergy status to sulfonamides status: Secondary | ICD-10-CM | POA: Diagnosis not present

## 2021-05-05 DIAGNOSIS — Z800 Family history of malignant neoplasm of digestive organs: Secondary | ICD-10-CM | POA: Diagnosis not present

## 2021-05-05 DIAGNOSIS — I517 Cardiomegaly: Secondary | ICD-10-CM | POA: Diagnosis not present

## 2021-05-05 DIAGNOSIS — I5022 Chronic systolic (congestive) heart failure: Secondary | ICD-10-CM | POA: Diagnosis not present

## 2021-05-05 DIAGNOSIS — R918 Other nonspecific abnormal finding of lung field: Secondary | ICD-10-CM | POA: Diagnosis not present

## 2021-05-05 DIAGNOSIS — R059 Cough, unspecified: Secondary | ICD-10-CM | POA: Diagnosis not present

## 2021-05-05 DIAGNOSIS — J1282 Pneumonia due to coronavirus disease 2019: Secondary | ICD-10-CM | POA: Diagnosis not present

## 2021-05-05 DIAGNOSIS — I4891 Unspecified atrial fibrillation: Secondary | ICD-10-CM | POA: Diagnosis not present

## 2021-05-05 DIAGNOSIS — R110 Nausea: Secondary | ICD-10-CM | POA: Diagnosis not present

## 2021-05-05 DIAGNOSIS — R1013 Epigastric pain: Secondary | ICD-10-CM | POA: Diagnosis not present

## 2021-05-05 DIAGNOSIS — J9811 Atelectasis: Secondary | ICD-10-CM | POA: Diagnosis not present

## 2021-05-06 ENCOUNTER — Telehealth: Payer: Self-pay

## 2021-05-06 DIAGNOSIS — U071 COVID-19: Secondary | ICD-10-CM | POA: Insufficient documentation

## 2021-05-06 DIAGNOSIS — R110 Nausea: Secondary | ICD-10-CM | POA: Diagnosis not present

## 2021-05-06 DIAGNOSIS — R059 Cough, unspecified: Secondary | ICD-10-CM | POA: Diagnosis not present

## 2021-05-06 DIAGNOSIS — R1013 Epigastric pain: Secondary | ICD-10-CM | POA: Diagnosis not present

## 2021-05-06 DIAGNOSIS — R058 Other specified cough: Secondary | ICD-10-CM | POA: Insufficient documentation

## 2021-05-06 NOTE — Telephone Encounter (Signed)
Copied from [place] [phone]. Topic: General - Other >> May 06, 2021  3:14 PM Tessa Lerner A wrote: Reason for CRM: Patient's daughter would like for the patient to be prescribed Zofran to help with nausea and discomfort  The patient was prescribed the medication previously while hospitalized and shared that it was very effective  Please contact patient's daughter to discuss further

## 2021-05-07 ENCOUNTER — Other Ambulatory Visit: Payer: Self-pay | Admitting: Family Medicine

## 2021-05-07 DIAGNOSIS — R110 Nausea: Secondary | ICD-10-CM

## 2021-05-07 MED ORDER — ONDANSETRON HCL 4 MG PO TABS: 4.0000 mg | ORAL_TABLET | Freq: Three times a day (TID) | ORAL | 0 refills | Status: AC | PRN

## 2021-05-07 NOTE — Telephone Encounter (Signed)
Sent to [facility].

## 2021-05-07 NOTE — Telephone Encounter (Signed)
Patients daughter called back about if nausea medication will be sent to pharmacy. Please call back

## 2021-05-13 ENCOUNTER — Telehealth: Payer: Self-pay | Admitting: Family Medicine

## 2021-05-13 ENCOUNTER — Ambulatory Visit: Payer: Self-pay | Admitting: *Deleted

## 2021-05-13 NOTE — Telephone Encounter (Signed)
Patient's daughter advised as below. She reports that her mother is feeling better after lunch. Will check in with her to see if she need to be seen at the ED or schedule an appt for tomorrow.

## 2021-05-13 NOTE — Telephone Encounter (Signed)
Noted  

## 2021-05-13 NOTE — Telephone Encounter (Signed)
Needs to be evaluated. Do we have any appts available?  Certainly needs ED if worsening

## 2021-05-13 NOTE — Telephone Encounter (Signed)
Pt's daughter calling. States pt positive covid 05/05/21. Symptoms started 05/04/21. CAlling with concerns chest tightness remain. States at bottom of rib area. Reports Mostly in mornings and pt states feels better after she coughs up phlegm. Phlegm is whitish. Pt also reports no energy. Completed course of Paxlovid 05/10/21. Daughter concerned as pt had CT lungs 03/25/21 that showed fluid in lungs. Pulse OX 95-98 on RA. Reports nausea is better. Also reports pt is still testing positive covid. Assured NTwould route to practice for PCPs review and final disposition. Advised ED for worsening symptoms. Daughter verbalizes understanding. States any advise would be greatly appreciated. CB# [phone]     Reason for Disposition  [1] PERSISTING SYMPTOMS OF COVID-19 AND [2] NO medical visit for COVID-19 in past 2 weeks    Positive 05/05/21, ED  Answer Assessment - Initial Assessment Questions 1. COVI-19 ONSET: "When did the symptoms of COVID-19 first start?"     05/04/21 2. DIAGNOSIS CONFIRMATION: "How were you diagnosed?" (e.g., COVID-19 oral or nasal viral test; COVID-19 antibody test; doctor visit)     ED 05/05/21 3. MAIN SYMPTOM:  "What is your main concern or symptom right now?" (e.g., breathing difficulty, cough, fatigue. loss of smell)     Chest tightness 4. SYMPTOM ONSET: "When did the  *No Answer*  start?"      5. BETTER-SAME-WORSE: "Are you getting better, staying the same, or getting worse over the last 1 to 2 weeks?"     *No Answer* 6. RECENT MEDICAL VISIT: "Have you been seen by a healthcare provider (doctor, NP, PA) for these persisting COVID-19 symptoms?" If Yes, ask: "When were you seen?" (e.g., date)     *No Answer* 7. COUGH: "Do you have a cough?" If Yes, ask: "How bad is the cough?"       *No Answer* 8. FEVER: "Do you have a fever?" If Yes, ask: "What is your temperature, how was it measured, and when did it start?"     *No Answer* 9. BREATHING DIFFICULTY: "Are you having any trouble  breathing?" If Yes, ask: "How bad is your breathing?" (e.g., mild, moderate, severe)    - MILD: No SOB at rest, mild SOB with walking, speaks normally in sentences, can lie down, no retractions, pulse < 100.    - MODERATE: SOB at rest, SOB with minimal exertion and prefers to sit, cannot lie down flat, speaks in phrases, mild retractions, audible wheezing, pulse 100-120.    - SEVERE: Very SOB at rest, speaks in single words, struggling to breathe, sitting hunched forward, retractions, pulse > 120       *No Answer* 10. HIGH RISK DISEASE: "Do you have any chronic medical problems?" (e.g., asthma, heart or lung disease, weak immune system, obesity, etc.)       *No Answer* 11. VACCINE: "Have you gotten the COVID-19 vaccine?" If Yes, ask: "Which one, how many shots, when did you get it?"       *No Answer* 12. BOOSTER: "Have you received your COVID-19 booster?" If Yes, ask: "Which one and when did you get it?"       *No Answer*  14. OTHER SYMPTOMS: "Do you have any other symptoms?"  (e.g., fatigue, headache, muscle pain, weakness)       Fatigue,cough, whitish 15. O2 SATURATION MONITOR:  "Do you use an oxygen saturation monitor (pulse oximeter) at home?" If Yes, ask "What is your reading (oxygen level) today?" "What is your usual oxygen saturation reading?" (e.g., 95%)  95-98% Room air  Protocols used: Coronavirus (COVID-19) Persisting Symptoms Follow-up Call-[facility]

## 2021-05-13 NOTE — Telephone Encounter (Signed)
Sherri calling back to advise pt is feeling better and would prefer to wait and not come in the office tomorrow.  She is still testing positive for covid.Venida Jarvis said if pt gets worse, she will take her to the emergency room. Bottom line, sherri said they will call back when she is not covid positive and feeling better to come in to see the dr.

## 2021-06-07 DIAGNOSIS — H353132 Nonexudative age-related macular degeneration, bilateral, intermediate dry stage: Secondary | ICD-10-CM | POA: Diagnosis not present

## 2021-06-11 DIAGNOSIS — R1013 Epigastric pain: Secondary | ICD-10-CM | POA: Diagnosis not present

## 2021-07-17 DIAGNOSIS — L570 Actinic keratosis: Secondary | ICD-10-CM | POA: Diagnosis not present

## 2021-07-17 DIAGNOSIS — L298 Other pruritus: Secondary | ICD-10-CM | POA: Diagnosis not present

## 2021-07-17 DIAGNOSIS — L718 Other rosacea: Secondary | ICD-10-CM | POA: Diagnosis not present

## 2021-07-17 DIAGNOSIS — L821 Other seborrheic keratosis: Secondary | ICD-10-CM | POA: Diagnosis not present

## 2021-07-17 DIAGNOSIS — Z872 Personal history of diseases of the skin and subcutaneous tissue: Secondary | ICD-10-CM | POA: Diagnosis not present

## 2021-07-17 DIAGNOSIS — L578 Other skin changes due to chronic exposure to nonionizing radiation: Secondary | ICD-10-CM | POA: Diagnosis not present

## 2021-10-29 DIAGNOSIS — I4891 Unspecified atrial fibrillation: Secondary | ICD-10-CM | POA: Diagnosis not present

## 2021-11-13 DIAGNOSIS — I4891 Unspecified atrial fibrillation: Secondary | ICD-10-CM | POA: Diagnosis not present

## 2021-12-05 DIAGNOSIS — H353132 Nonexudative age-related macular degeneration, bilateral, intermediate dry stage: Secondary | ICD-10-CM | POA: Diagnosis not present

## 2022-01-13 ENCOUNTER — Encounter: Payer: Self-pay | Admitting: Family Medicine

## 2022-01-13 ENCOUNTER — Ambulatory Visit (INDEPENDENT_AMBULATORY_CARE_PROVIDER_SITE_OTHER): Payer: Medicare Other | Admitting: Family Medicine

## 2022-01-13 VITALS — BP 139/84 | HR 80 | Temp 98.4°F | Resp 14 | Wt 103.7 lb

## 2022-01-13 DIAGNOSIS — S161XXA Strain of muscle, fascia and tendon at neck level, initial encounter: Secondary | ICD-10-CM | POA: Diagnosis not present

## 2022-01-13 MED ORDER — TIZANIDINE HCL 2 MG PO CAPS: 2.0000 mg | ORAL_CAPSULE | Freq: Three times a day (TID) | ORAL | 0 refills | Status: AC

## 2022-01-13 NOTE — Progress Notes (Signed)
?  ? ?. ?I,Jana Robinson,acting as a scribe for Lelon Huh, MD.,have documented all relevant documentation on the behalf of Lelon Huh, MD,as directed by  Lelon Huh, MD while in the presence of Lelon Huh, MD. ? ?Established patient visit ? ? ?Patient: Margaret Hall   DOB: 04-02-1925   88 y.o. Female  MRN: [id number] ?Visit Date: 01/13/2022 ? ?Today's healthcare provider: Lelon Huh, MD ?Subjective  ?  ?Shoulder Pain  ?The pain is present in the right shoulder. This is a new problem. The current episode started 1 to 4 weeks ago (10 days). There has been no history of extremity trauma. The problem occurs constantly. The problem has been unchanged (feels a knot). The quality of the pain is described as sharp (sore / can be a spasm). The pain is moderate (varies / more later in day). Pertinent negatives include no fever, inability to bear weight, itching, joint locking, joint swelling, limited range of motion, numbness, stiffness or tingling. Exacerbated by: more movement. She has tried cold, heat, OTC ointments and NSAIDS for the symptoms. The treatment provided mild relief. Family history does not include gout or rheumatoid arthritis. Her past medical history is significant for osteoarthritis. There is no history of diabetes, gout or rheumatoid arthritis. in fingles  She had been gardening quite a bit a few days before onset of pain.  ? ? ? ?Medications: ?Outpatient Medications Prior to Visit  ?Medication Sig  ? acetaminophen (TYLENOL) 500 MG tablet Take 500 mg every 6 (six) hours as needed by mouth.  ? apixaban (ELIQUIS) 2.5 MG TABS tablet Take 2.5 mg by mouth 2 (two) times daily.   ? atorvastatin (LIPITOR) 20 MG tablet Take 20 mg by mouth daily at 6 PM.   ? BISMUTH SUBSALICYLATE PO as needed.   ? Multiple Vitamins-Minerals (EYE VITAMINS PO) Take by mouth 2 (two) times daily.  ? Multiple Vitamins-Minerals (PRESERVISION AREDS 2 PO) Take 1 capsule by mouth 2 (two) times daily.  ? amoxicillin  (AMOXIL) 875 MG tablet Take 1 tablet (875 mg total) by mouth 2 (two) times daily.  ? benzonatate (TESSALON) 100 MG capsule Take 1 capsule (100 mg total) by mouth 2 (two) times daily as needed for cough. (Patient not taking: Reported on 12/19/2020)  ? ondansetron (ZOFRAN) 4 MG tablet Take 1 tablet (4 mg total) by mouth every 8 (eight) hours as needed for nausea or vomiting. (Patient not taking: Reported on 01/13/2022)  ? ?No facility-administered medications prior to visit.  ? ? ?Review of Systems  ?Constitutional:  Negative for fever.  ?Musculoskeletal:  Negative for gout and stiffness.  ?Skin:  Negative for itching.  ?Neurological:  Negative for tingling and numbness.  ?All other systems reviewed and are negative. ? ? ?  Objective  ?  ?BP 139/84 (BP Location: Left Arm, Patient Position: Sitting, Cuff Size: Normal)   Pulse 80   Temp 98.4 ?F (36.9 ?C) (Oral)   Resp 14   Wt 103 lb 11.2 oz (47 kg)   SpO2 100%   BMI 19.59 kg/m?  ? ? ?Physical Exam  ?Point tenderness right posterior trapezius just lateral to lower cervical spine.  ? Assessment & Plan  ?  ? ?1. Strain of neck muscle, initial encounter ?Recommend warm compress, OTC tylenol prn. Avoid OTC NSAIDs such as ibuprofen.  ? ?- tizanidine (ZANAFLEX) 2 MG capsule; Take 1 capsule (2 mg total) by mouth 3 (three) times daily.  Dispense: 30 capsule; Refill: 0  ? ?Call if symptoms change or  if not rapidly improving.  ?    ? ?The entirety of the information documented in the History of Present Illness, Review of Systems and Physical Exam were personally obtained by me. Portions of this information were initially documented by the CMA and reviewed by me for thoroughness and accuracy.   ? ? ?Lelon Huh, MD  ?[facility] ?[phone] (phone) ?[phone] (fax) ? ?[facility]  ?

## 2022-04-03 ENCOUNTER — Ambulatory Visit (INDEPENDENT_AMBULATORY_CARE_PROVIDER_SITE_OTHER): Payer: Medicare Other

## 2022-04-03 VITALS — Wt 103.0 lb

## 2022-04-03 DIAGNOSIS — Z0000 Encounter for general adult medical examination without abnormal findings: Secondary | ICD-10-CM | POA: Diagnosis not present

## 2022-04-03 NOTE — Patient Instructions (Signed)
Margaret Hall , Thank you for taking time to come for your Medicare Wellness Visit. I appreciate your ongoing commitment to your health goals. Please review the following plan we discussed and let me know if I can assist you in the future.   Screening recommendations/referrals: Colonoscopy: aged out Mammogram: aged out Bone Density: aged out Recommended yearly ophthalmology/optometry visit for glaucoma screening and checkup Recommended yearly dental visit for hygiene and checkup  Vaccinations: Influenza vaccine: 10/17/20 Pneumococcal vaccine: n/d Tdap vaccine: n/d Shingles vaccine: n/d   Covid-19:10/15/19, 11/07/19, 07/04/20  Advanced directives: no  Conditions/risks identified: none  Next appointment: Follow up in one year for your annual wellness visit 04/07/23 @ 10 am by phone   Preventive Care 65 Years and Older, Female Preventive care refers to lifestyle choices and visits with your health care provider that can promote health and wellness. What does preventive care include? A yearly physical exam. This is also called an annual well check. Dental exams once or twice a year. Routine eye exams. Ask your health care provider how often you should have your eyes checked. Personal lifestyle choices, including: Daily care of your teeth and gums. Regular physical activity. Eating a healthy diet. Avoiding tobacco and drug use. Limiting alcohol use. Practicing safe sex. Taking low-dose aspirin every day. Taking vitamin and mineral supplements as recommended by your health care provider. What happens during an annual well check? The services and screenings done by your health care provider during your annual well check will depend on your age, overall health, lifestyle risk factors, and family history of disease. Counseling  Your health care provider may ask you questions about your: Alcohol use. Tobacco use. Drug use. Emotional well-being. Home and relationship well-being. Sexual  activity. Eating habits. History of falls. Memory and ability to understand (cognition). Work and work Statistician. Reproductive health. Screening  You may have the following tests or measurements: Height, weight, and BMI. Blood pressure. Lipid and cholesterol levels. These may be checked every 5 years, or more frequently if you are over 87 years old. Skin check. Lung cancer screening. You may have this screening every year starting at age 24 if you have a 30-pack-year history of smoking and currently smoke or have quit within the past 15 years. Fecal occult blood test (FOBT) of the stool. You may have this test every year starting at age 65. Flexible sigmoidoscopy or colonoscopy. You may have a sigmoidoscopy every 5 years or a colonoscopy every 10 years starting at age 31. Hepatitis C blood test. Hepatitis B blood test. Sexually transmitted disease (STD) testing. Diabetes screening. This is done by checking your blood sugar (glucose) after you have not eaten for a while (fasting). You may have this done every 1-3 years. Bone density scan. This is done to screen for osteoporosis. You may have this done starting at age 58. Mammogram. This may be done every 1-2 years. Talk to your health care provider about how often you should have regular mammograms. Talk with your health care provider about your test results, treatment options, and if necessary, the need for more tests. Vaccines  Your health care provider may recommend certain vaccines, such as: Influenza vaccine. This is recommended every year. Tetanus, diphtheria, and acellular pertussis (Tdap, Td) vaccine. You may need a Td booster every 10 years. Zoster vaccine. You may need this after age 84. Pneumococcal 13-valent conjugate (PCV13) vaccine. One dose is recommended after age 4. Pneumococcal polysaccharide (PPSV23) vaccine. One dose is recommended after age 30. Talk to  your health care provider about which screenings and vaccines  you need and how often you need them. This information is not intended to replace advice given to you by your health care provider. Make sure you discuss any questions you have with your health care provider. Document Released: 10/12/2015 Document Revised: 06/04/2016 Document Reviewed: 07/17/2015 Elsevier Interactive Patient Education  2017 [place] Prevention in the Home Falls can cause injuries. They can happen to people of all ages. There are many things you can do to make your home safe and to help prevent falls. What can I do on the outside of my home? Regularly fix the edges of walkways and driveways and fix any cracks. Remove anything that might make you trip as you walk through a door, such as a raised step or threshold. Trim any bushes or trees on the path to your home. Use bright outdoor lighting. Clear any walking paths of anything that might make someone trip, such as rocks or tools. Regularly check to see if handrails are loose or broken. Make sure that both sides of any steps have handrails. Any raised decks and porches should have guardrails on the edges. Have any leaves, snow, or ice cleared regularly. Use sand or salt on walking paths during winter. Clean up any spills in your garage right away. This includes oil or grease spills. What can I do in the bathroom? Use night lights. Install grab bars by the toilet and in the tub and shower. Do not use towel bars as grab bars. Use non-skid mats or decals in the tub or shower. If you need to sit down in the shower, use a plastic, non-slip stool. Keep the floor dry. Clean up any water that spills on the floor as soon as it happens. Remove soap buildup in the tub or shower regularly. Attach bath mats securely with double-sided non-slip rug tape. Do not have throw rugs and other things on the floor that can make you trip. What can I do in the bedroom? Use night lights. Make sure that you have a light by your bed that  is easy to reach. Do not use any sheets or blankets that are too big for your bed. They should not hang down onto the floor. Have a firm chair that has side arms. You can use this for support while you get dressed. Do not have throw rugs and other things on the floor that can make you trip. What can I do in the kitchen? Clean up any spills right away. Avoid walking on wet floors. Keep items that you use a lot in easy-to-reach places. If you need to reach something above you, use a strong step stool that has a grab bar. Keep electrical cords out of the way. Do not use floor polish or wax that makes floors slippery. If you must use wax, use non-skid floor wax. Do not have throw rugs and other things on the floor that can make you trip. What can I do with my stairs? Do not leave any items on the stairs. Make sure that there are handrails on both sides of the stairs and use them. Fix handrails that are broken or loose. Make sure that handrails are as long as the stairways. Check any carpeting to make sure that it is firmly attached to the stairs. Fix any carpet that is loose or worn. Avoid having throw rugs at the top or bottom of the stairs. If you do have throw rugs, attach  them to the floor with carpet tape. Make sure that you have a light switch at the top of the stairs and the bottom of the stairs. If you do not have them, ask someone to add them for you. What else can I do to help prevent falls? Wear shoes that: Do not have high heels. Have rubber bottoms. Are comfortable and fit you well. Are closed at the toe. Do not wear sandals. If you use a stepladder: Make sure that it is fully opened. Do not climb a closed stepladder. Make sure that both sides of the stepladder are locked into place. Ask someone to hold it for you, if possible. Clearly mark and make sure that you can see: Any grab bars or handrails. First and last steps. Where the edge of each step is. Use tools that help you  move around (mobility aids) if they are needed. These include: Canes. Walkers. Scooters. Crutches. Turn on the lights when you go into a dark area. Replace any light bulbs as soon as they burn out. Set up your furniture so you have a clear path. Avoid moving your furniture around. If any of your floors are uneven, fix them. If there are any pets around you, be aware of where they are. Review your medicines with your doctor. Some medicines can make you feel dizzy. This can increase your chance of falling. Ask your doctor what other things that you can do to help prevent falls. This information is not intended to replace advice given to you by your health care provider. Make sure you discuss any questions you have with your health care provider. Document Released: 07/12/2009 Document Revised: 02/21/2016 Document Reviewed: 10/20/2014 Elsevier Interactive Patient Education  2017 [employer].

## 2022-04-03 NOTE — Progress Notes (Signed)
Virtual Visit via Telephone Note  I connected with  Margaret Hall on 04/03/22 at 10:30 AM EDT by telephone and verified that I am speaking with the correct person using two identifiers.  Location: Patient: home Provider: [facility] Persons participating in the virtual visit: [place]   I discussed the limitations, risks, security and privacy concerns of performing an evaluation and management service by telephone and the availability of in person appointments. The patient expressed understanding and agreed to proceed.  Interactive audio and video telecommunications were attempted between this nurse and patient, however failed, due to patient having technical difficulties OR patient did not have access to video capability.  We continued and completed visit with audio only.  Some vital signs may be absent or patient reported.   Dionisio David, LPN  Subjective:   Margaret Hall is a 88 y.o. female who presents for Medicare Annual (Subsequent) preventive examination.  Review of Systems     Cardiac Risk Factors include: advanced age (>42mn, >>64women);dyslipidemia     Objective:    There were no vitals filed for this visit. There is no height or weight on file to calculate BMI.     04/03/2022   10:43 AM 12/19/2020    1:38 PM 12/07/2019    3:38 PM 08/16/2018    9:19 AM 08/04/2017    1:14 PM  Advanced Directives  Does Patient Have a Medical Advance Directive? No Yes Yes Yes Yes  Type of ACorporate treasurerof AH[place]Living will [place]Living will [place]Living will [place]Living will  Copy of [place] Chart?  Yes - validated most recent copy scanned in chart (See row information) Yes - validated most recent copy scanned in chart (See row information) Yes - validated most recent copy scanned in chart (See row information) No - copy requested  Would patient  like information on creating a medical advance directive? No - Patient declined        Current Medications (verified) Outpatient Encounter Medications as of 04/03/2022  Medication Sig   acetaminophen (TYLENOL) 500 MG tablet Take 500 mg every 6 (six) hours as needed by mouth.   apixaban (ELIQUIS) 2.5 MG TABS tablet Take 2.5 mg by mouth 2 (two) times daily.    atorvastatin (LIPITOR) 20 MG tablet Take 20 mg by mouth daily at 6 PM.    BISMUTH SUBSALICYLATE PO as needed.    Multiple Vitamins-Minerals (EYE VITAMINS PO) Take by mouth 2 (two) times daily.   Multiple Vitamins-Minerals (PRESERVISION AREDS 2 PO) Take 1 capsule by mouth 2 (two) times daily.   ondansetron (ZOFRAN) 4 MG tablet Take 1 tablet (4 mg total) by mouth every 8 (eight) hours as needed for nausea or vomiting. (Patient not taking: Reported on 01/13/2022)   tizanidine (ZANAFLEX) 2 MG capsule Take 1 capsule (2 mg total) by mouth 3 (three) times daily. (Patient not taking: Reported on 04/03/2022)   No facility-administered encounter medications on file as of 04/03/2022.    Allergies (verified) Sulfa antibiotics and Sulfasalazine   History: Past Medical History:  Diagnosis Date   Cancer ([place]    skin cancer- removed 10/06/16   Hyperlipidemia    Stroke ([facility]    Past Surgical History:  Procedure Laterality Date   APPENDECTOMY  1965   BREAST BIOPSY Right 2002   BREAST LUMPECTOMY Right 2002   [facility]  Family History  Problem Relation Age of Onset   Melanoma Brother    Stroke Brother    Heart attack Brother    Heart disease Sister    Social History   Socioeconomic History   Marital status: Widowed    Spouse name: Not on file   Number of children: 5   Years of education: Not on file   Highest education level: Some college, no degree  Occupational History   Occupation: retired  Tobacco Use   Smoking status: Never   Smokeless tobacco: Never  Vaping Use   Vaping Use: Never used   Substance and Sexual Activity   Alcohol use: No    Alcohol/week: 0.0 standard drinks of alcohol   Drug use: No   Sexual activity: Never  Other Topics Concern   Not on file  Social History Narrative   Not on file   Social Determinants of Health   Financial Resource Strain: Low Risk  (04/03/2022)   Overall Financial Resource Strain (CARDIA)    Difficulty of Paying Living Expenses: Not hard at all  Food Insecurity: No Food Insecurity (04/03/2022)   Hunger Vital Sign    Worried About Running Out of Food in the Last Year: Never true    [place] in the Last Year: Never true  Transportation Needs: No Transportation Needs (04/03/2022)   PRAPARE - Hydrologist (Medical): No    Lack of Transportation (Non-Medical): No  Physical Activity: Insufficiently Active (04/03/2022)   Exercise Vital Sign    Days of Exercise per Week: 3 days    Minutes of Exercise per Session: 30 min  Stress: No Stress Concern Present (04/03/2022)   [place]    Feeling of Stress : Not at all  Social Connections: Socially Isolated (04/03/2022)   Social Connection and Isolation Panel [NHANES]    Frequency of Communication with Friends and Family: More than three times a week    Frequency of Social Gatherings with Friends and Family: More than three times a week    Attends Religious Services: Never    Marine scientist or Organizations: No    Attends Archivist Meetings: Never    Marital Status: Widowed    Tobacco Counseling Counseling given: Not Answered   Clinical Intake:  Pre-visit preparation completed: Yes  Pain : No/denies pain     Nutritional Risks: None Diabetes: No  How often do you need to have someone help you when you read instructions, pamphlets, or other written materials from your doctor or pharmacy?: 1 - Never  Diabetic?no  Interpreter Needed?: No  Information entered by ::  Kirke Shaggy, LPN   Activities of Daily Living    04/03/2022   10:44 AM  In your present state of health, do you have any difficulty performing the following activities:  Hearing? 1  Vision? 1  Difficulty concentrating or making decisions? 0  Walking or climbing stairs? 0  Dressing or bathing? 0  Doing errands, shopping? 0  Preparing Food and eating ? N  Using the Toilet? N  In the past six months, have you accidently leaked urine? N  Do you have problems with loss of bowel control? N  Managing your Medications? N  Managing your Finances? N  Housekeeping or managing your Housekeeping? N    Patient Care Team: Mikey Kirschner, PA-C as PCP - General (Physician Assistant) Estill Cotta, MD as Consulting Physician (Ophthalmology) Glade Lloyd,  Lyanne Co, MD as Referring Physician (Internal Medicine) Lawana Pai, MD (Inactive) as Consulting Physician (Neurology) Dwan Bolt, MD as Referring Physician (Cardiology) Throat, [place] Ear Nose And  Indicate any recent Medical Services you may have received from other than [facility] providers in the past year (date may be approximate).     Assessment:   This is a routine wellness examination for [facility].  Hearing/Vision screen Hearing Screening - Comments:: Wears aids Vision Screening - Comments:: Wears glasses- [facility]  Dietary issues and exercise activities discussed: Current Exercise Habits: Home exercise routine, Type of exercise: walking, Time (Minutes): 30, Frequency (Times/Week): 3, Weekly Exercise (Minutes/Week): 90, Intensity: Mild   Goals Addressed             This Visit's Progress    DIET - EAT MORE FRUITS AND VEGETABLES         Depression Screen    04/03/2022   10:40 AM 12/19/2020    1:35 PM 12/19/2020    1:34 PM 12/07/2019    3:35 PM 08/16/2018    9:27 AM 08/16/2018    9:19 AM 08/04/2017    1:15 PM  PHQ 2/9 Scores  PHQ - 2 Score 1 1 0 0 0 0 0  PHQ- 9 Score 1    0      Fall Risk     04/03/2022   10:44 AM 12/19/2020    1:39 PM 12/07/2019    3:38 PM 07/12/2019    3:55 PM 08/16/2018    9:19 AM  Fall Risk   Falls in the past year? 1 0 0 0 0  Number falls in past yr: 0 0 0 0   Injury with Fall? 0 0 0 0   Risk for fall due to : History of fall(s)      Follow up Falls evaluation completed;Falls prevention discussed        FALL RISK PREVENTION PERTAINING TO THE HOME:  Any stairs in or around the home? No  If so, are there any without handrails? No  Home free of loose throw rugs in walkways, pet beds, electrical cords, etc? Yes  Adequate lighting in your home to reduce risk of falls? Yes   ASSISTIVE DEVICES UTILIZED TO PREVENT FALLS:  Life alert? No  Use of a cane, walker or w/c? No  Grab bars in the bathroom? Yes  Shower chair or bench in shower? No  Elevated toilet seat or a handicapped toilet? No   Cognitive Function:        04/03/2022   10:46 AM  6CIT Screen  What Year? 0 points  What month? 0 points  What time? 0 points  Count back from 20 0 points  Months in reverse 0 points  Repeat phrase 2 points  Total Score 2 points    Immunizations Immunization History  Administered Date(s) Administered   Fluad Quad(high Dose 65+) 10/17/2020   Influenza, High Dose Seasonal PF 07/29/2016, 07/29/2016, 08/04/2017, 08/04/2017, 08/16/2018, 08/16/2018   PFIZER(Purple Top)SARS-COV-2 Vaccination 10/15/2019, 11/07/2019, 07/04/2020    TDAP status: Due, Education has been provided regarding the importance of this vaccine. Advised may receive this vaccine at local pharmacy or Health Dept. Aware to provide a copy of the vaccination record if obtained from local pharmacy or Health Dept. Verbalized acceptance and understanding.  Flu Vaccine status: Up to date  Pneumococcal vaccine status: Declined,  Education has been provided regarding the importance of this vaccine but patient still declined. Advised may receive this vaccine at local pharmacy  or Health Dept. Aware to  provide a copy of the vaccination record if obtained from local pharmacy or Health Dept. Verbalized acceptance and understanding.   Covid-19 vaccine status: Completed vaccines  Qualifies for Shingles Vaccine? Yes   Zostavax completed No   Shingrix Completed?: No.    Education has been provided regarding the importance of this vaccine. Patient has been advised to call insurance company to determine out of pocket expense if they have not yet received this vaccine. Advised may also receive vaccine at local pharmacy or Health Dept. Verbalized acceptance and understanding.  Screening Tests Health Maintenance  Topic Date Due   Zoster Vaccines- Shingrix (1 of 2) Never done   Pneumonia Vaccine 58+ Years old (1 - PCV) Never done   DEXA SCAN  Never done   COVID-19 Vaccine (4 - Booster for Pfizer series) 08/29/2020   TETANUS/TDAP  08/24/2023 (Originally 10/12/1943)   INFLUENZA VACCINE  04/29/2022   HPV VACCINES  Aged Out    Health Maintenance  Health Maintenance Due  Topic Date Due   Zoster Vaccines- Shingrix (1 of 2) Never done   Pneumonia Vaccine 21+ Years old (1 - PCV) Never done   DEXA SCAN  Never done   COVID-19 Vaccine (4 - Booster for [place] series) 08/29/2020    Colorectal cancer screening: No longer required.   Mammogram status: No longer required due to age.  Lung Cancer Screening: (Low Dose CT Chest recommended if Age 34-80 years, 30 pack-year currently smoking OR have quit w/in 15years.) does not qualify.   Additional Screening:  Hepatitis C Screening: does not qualify; Completed no  Vision Screening: Recommended annual ophthalmology exams for early detection of glaucoma and other disorders of the eye. Is the patient up to date with their annual eye exam?  Yes  Who is the provider or what is the name of the office in which the patient attends annual eye exams? [facility] If pt is not established with a provider, would they like to be referred to a provider to  establish care? No .   Dental Screening: Recommended annual dental exams for proper oral hygiene  Community Resource Referral / Chronic Care Management: CRR required this visit?  No   CCM required this visit?  No      Plan:     I have personally reviewed and noted the following in the patient's chart:   Medical and social history Use of alcohol, tobacco or illicit drugs  Current medications and supplements including opioid prescriptions.  Functional ability and status Nutritional status Physical activity Advanced directives List of other physicians Hospitalizations, surgeries, and ER visits in previous 12 months Vitals Screenings to include cognitive, depression, and falls Referrals and appointments  In addition, I have reviewed and discussed with patient certain preventive protocols, quality metrics, and best practice recommendations. A written personalized care plan for preventive services as well as general preventive health recommendations were provided to patient.     Dionisio David, LPN   03/02/159   Nurse Notes: none

## 2022-06-17 ENCOUNTER — Encounter: Payer: Self-pay | Admitting: Physician Assistant

## 2022-06-17 ENCOUNTER — Ambulatory Visit (INDEPENDENT_AMBULATORY_CARE_PROVIDER_SITE_OTHER): Payer: Medicare Other | Admitting: Physician Assistant

## 2022-06-17 VITALS — BP 138/80 | HR 69 | Ht 61.0 in | Wt 105.3 lb

## 2022-06-17 DIAGNOSIS — F321 Major depressive disorder, single episode, moderate: Secondary | ICD-10-CM

## 2022-06-17 DIAGNOSIS — Z23 Encounter for immunization: Secondary | ICD-10-CM

## 2022-06-17 NOTE — Progress Notes (Signed)
I,Sha'taria Tyson,acting as a Education administrator for [employer], PA-C.,have documented all relevant documentation on the behalf of Mikey Kirschner, PA-C,as directed by  Mikey Kirschner, PA-C while in the presence of Mikey Kirschner, PA-C.   Established patient visit   Patient: Margaret Hall   DOB: 03-25-1925   88 y.o. Female  MRN: [id number] Visit Date: 06/17/2022  Today's healthcare provider: Mikey Kirschner, PA-C   Cc. grief  Subjective    HPI  Pt reports today with daughter with concerns over anxiety and depressive symptoms for the last few weeks. She recently lost her live-in friend/partner and struggles with progressed macular degeneration and loss of eyesight. She is unable to drive, read, cook as well. Struggles with lack of independence/control. Unable to do fun things anymore.      06/17/2022    9:44 AM 04/03/2022   10:40 AM 12/19/2020    1:35 PM  PHQ9 SCORE ONLY  PHQ-9 Total Score '10 1 1    '$ Medications: Outpatient Medications Prior to Visit  Medication Sig   acetaminophen (TYLENOL) 500 MG tablet Take 500 mg every 6 (six) hours as needed by mouth.   apixaban (ELIQUIS) 2.5 MG TABS tablet Take 2.5 mg by mouth 2 (two) times daily.    atorvastatin (LIPITOR) 20 MG tablet Take 20 mg by mouth daily at 6 PM.    BISMUTH SUBSALICYLATE PO as needed.    Multiple Vitamins-Minerals (EYE VITAMINS PO) Take by mouth 2 (two) times daily.   Multiple Vitamins-Minerals (PRESERVISION AREDS 2 PO) Take 1 capsule by mouth 2 (two) times daily.   ondansetron (ZOFRAN) 4 MG tablet Take 1 tablet (4 mg total) by mouth every 8 (eight) hours as needed for nausea or vomiting. (Patient not taking: Reported on 01/13/2022)   tizanidine (ZANAFLEX) 2 MG capsule Take 1 capsule (2 mg total) by mouth 3 (three) times daily. (Patient not taking: Reported on 04/03/2022)   No facility-administered medications prior to visit.    Review of Systems  Constitutional:  Negative for fatigue and fever.  Respiratory:  Negative  for cough and shortness of breath.   Cardiovascular:  Negative for chest pain and leg swelling.  Gastrointestinal:  Negative for abdominal pain.  Neurological:  Negative for dizziness and headaches.  Psychiatric/Behavioral:  The patient is nervous/anxious.       Objective    Blood pressure 138/80, pulse 69, height '5\' 1"'$  (1.549 m), weight 105 lb 4.8 oz (47.8 kg), SpO2 100 %.   Physical Exam Vitals reviewed.  Constitutional:      Appearance: She is not ill-appearing.  HENT:     Head: Normocephalic.  Eyes:     Conjunctiva/sclera: Conjunctivae normal.  Cardiovascular:     Rate and Rhythm: Normal rate.  Pulmonary:     Effort: Pulmonary effort is normal. No respiratory distress.  Neurological:     General: No focal deficit present.     Mental Status: She is alert and oriented to person, place, and time.  Psychiatric:        Mood and Affect: Mood normal.        Behavior: Behavior normal.      No results found for any visits on 06/17/22.  Assessment & Plan     Problem List Items Addressed This Visit       Other   Depression, major, single episode, moderate ([place]) - Primary    Classifying as depression but is in reaction to significant life changes. Long discussion about adapting lifestyle, reaching out to  community for assistance, acceptance. Pt declines therapy. D/t her use of eliquis, many antidepressant meds are CI. Advised if this seems like the only option, to reach out to cardiologist to see if interaction of SSRI and eliquis can be tolerated, or if it is ok to switch anticoag to asa.  F/u prn        Return if symptoms worsen or fail to improve.      I, Mikey Kirschner, PA-C have reviewed all documentation for this visit. The documentation on  06/17/2022  for the exam, diagnosis, procedures, and orders are all accurate and complete.  Mikey Kirschner, PA-C [facility] [address] [place], [place], [address] Office: [phone] Fax:  [place]

## 2022-06-17 NOTE — Patient Instructions (Signed)
Excerpt from article: https://www.agingcare.com/articles/making-life-easier-for-older-adults-with-low-vision-177792.htm Helping Accept Visual Changes Eye diseases like macular degeneration, cataracts, glaucoma and diabetic retinopathy can have a significant impact on a senior's functional abilities and quality of life. For some, impaired vision may even result in depression, withdrawal and inactivity.  According to Pris Stann Mainland, Geophysical data processor of VisionAware.org, an informational website for individuals living with vision loss, caregivers are often equally confused and overwhelmed by their loved ones' visual changes. They do not know where to turn or how to help their elders who are struggling to accept new limitations.  "Many older adults believe that there is no way they can cope with this loss, since it affects almost all aspects of daily life," Stann Mainland explains. "But, caregivers and persons with visual impairment need to know that there is hope, and life, after vision loss."  Tips and Products for Helping a Senior with Low Vision It is best for caregivers to learn as much as possible about their care recipients' visual condition and the limitations they experience. This information will help you suggest appropriate modifications to their environment and behavior as well as products that can enhance their functional abilities.  While individual conditions affect eyesight differently, the following tips are an excellent starting point for helping a blind or visually impaired senior safely maintain their independence.  Good Lighting is Key Keep surroundings well-lit but be mindful of glare. Use specialized lamps/bulbs to increase contrast and reduce glare and cover reflective surfaces when possible. Ensure that appropriate lighting is provided for all activities your loved one engages in. For example, direct task lighting is best for things like reading, playing cards or crafting. Consider a small  gooseneck or clip-on lamp for these tasks. Under-counter lighting is another type that works well for illuminating the kitchen and other larger work areas.  Avoid large discrepancies in lighting, such as a bright lamp shining into a dark room. As task lighting is increased, the surrounding room lighting should also be increased. Keeping lights on during daytime hours helps to equalize lighting from both indoor and outdoor sources.  Take Steps to Minimize Fall Risks Use nightlights in bedrooms, hallways and bathrooms to reduce the risk of tripping and falling at night. Eliminate clutter and remove hazards such as throw rugs and electrical cords. Consider replacing or relocating short or difficult to see furniture, such as a glass coffee or side table. Create wide, clear and level walking paths that lead to all areas of the home for easy and safe navigation.  You may have to reposition some furnishings to make the home easier to navigate. This can be disorienting initially, so make sure to provide your loved one with extra assistance getting around until they have memorized the new layout. Larger-scale rearrangements may be inadvisable for some seniors, especially those with memory issues.  Improve Household Organization Designate spots for commonly used items and be sure to return objects to the same place every time so that your loved one always knows where things are. Sometimes using a basket to store like objects can make it easier to find things like keys, remotes to electronics and other items.  Combining tactile and visual systems can help seniors more easily navigate their environment, too. Tactile systems are helpful for those with limited or no vision, or for those whose visual abilities change from day to day. An example of a tactile system is placing rubber bands, felt, raised plastic dots or sandpaper cutouts on items to mark their placement or differentiate similar objects.  Visual systems  make use of any remaining vision to identify and organize things. Common examples include large labels or colored stickers or tapes to differentiate individual items or identify collections of items.  Embrace Contrasting Colors The juxtaposition of light and dark colors can make daily activities much easier for a person who still has some remaining vision. Like colors can make it difficult for those with visual impairments to detect doorways, stairs and furniture and especially smaller objects that blend into their surroundings.  For example, providing a white cuttingboard for preparing darker foods like apples and a dark board for lighter foods like onions can help extend independence and promote safety. This concept especially applies in settings like bathrooms, which tend to be monotone. Choose towels, washcloths and bath mats that contrast sharply with the color of the tub/shower, counters and flooring. Painting door jambs a contrasting color and using brightly colored tape to highlight the edges of steps are other modifications that can be used to improve safety in the home.  Think Bigger Magnification is an essential tool for those with low vision, and magnifying devices range from very simple to technologically advanced. Look for items that come with larger print/buttons, such as books, checkbooks, calendars, calculators, remote control units, clocks, watches, appointment books and playing cards.  For items that do not come in low-vision versions, magnifiers can be very helpful. Electronic magnification units use a camera to capture an image and project it onto a built-in monitor, a television screen or a computer screen. These units can be used to read bills and write checks, read books, look at photos, and complete intricate tasks like filling an insulin syringe. Available through low vision supply companies, they can be chosen with preference for image size, degree of contrast, and color or black  and white. They range in size from large models to small portable devices. For more technologically savvy seniors, adaptive equipment, such as screen enlargement software, large-lettered keyboards and near telescopic systems are available to facilitate computer use.  Work with a Primary school teacher Low vision specialists have the knowledge and experience to devise personalized solutions for a visually impaired individual's specific needs. Vision rehabilitation can help with mobility training as well as methods of organizing, marking and labeling household items. These specialists are also familiar with resources for obtaining low vision aids and can instruct their clients on how to use them properly. Many vision rehabilitation programs even offer mental health services to help participants cope with the anxiety or depression that often accompanies vision loss.  Provide Moral Support It's important to create a strong support system for those with new or worsening visual impairments. Encourage your loved one to remain active with their friends and stick with the hobbies and pastimes they enjoy. Offer to accompany or assist them with these things so they can be more confident in their ability to participate.  Encourage open and honest communication as well. Some people with low vision experience hallucinations known as Sherran Needs syndrome (CBS). This is often confused with dementia but is very different. Although these hallucinations are harmless, they can be unsettling. Let your loved one know they can talk to you about new symptoms and if something seems to be amiss.  Seniors often worry that sight impairments will affect their ability to live independently. Put your loved one at ease by suggesting resources that will allow them to remain independent, and help them implement the tips above to improve their ability to complete day-to-day tasks  on their own.

## 2022-06-17 NOTE — Assessment & Plan Note (Signed)
Classifying as depression but is in reaction to significant life changes. Long discussion about adapting lifestyle, reaching out to community for assistance, acceptance. Pt declines therapy. D/t her use of eliquis, many antidepressant meds are CI. Advised if this seems like the only option, to reach out to cardiologist to see if interaction of SSRI and eliquis can be tolerated, or if it is ok to switch anticoag to asa.  F/u prn

## 2022-06-19 DIAGNOSIS — H353211 Exudative age-related macular degeneration, right eye, with active choroidal neovascularization: Secondary | ICD-10-CM | POA: Diagnosis not present

## 2022-09-12 ENCOUNTER — Other Ambulatory Visit: Payer: Self-pay | Admitting: *Deleted

## 2022-09-12 NOTE — Patient Outreach (Signed)
Per [facility] Mrs. Dodge resides in [facility]. Screening for potential [facility] care coordination services as benefit of insurance and PCP.   Sent secure communication to SNF social workers to inquire about anticipated transition plans.   Will continue to follow.   Marthenia Rolling, MSN, RN,BSN [place] [facility] Coordinator [phone] (Direct dial)

## 2023-04-07 ENCOUNTER — Ambulatory Visit (INDEPENDENT_AMBULATORY_CARE_PROVIDER_SITE_OTHER): Payer: Medicare HMO

## 2023-04-07 VITALS — Ht 61.0 in | Wt 100.0 lb

## 2023-04-07 DIAGNOSIS — Z0000 Encounter for general adult medical examination without abnormal findings: Secondary | ICD-10-CM

## 2023-04-07 NOTE — Progress Notes (Signed)
Subjective:   Margaret Hall is a 89 y.o. female who presents for Medicare Annual (Subsequent) preventive examination.  Visit Complete: Virtual  I connected with  Margaret Hall on 04/07/23 by a audio enabled telemedicine application and verified that I am speaking with the correct person using two identifiers.  Patient Location: Home  Provider Location: Office/Clinic  I discussed the limitations of evaluation and management by telemedicine. The patient expressed understanding and agreed to proceed.  Review of Systems     Cardiac Risk Factors include: advanced age (>75men, >19 women)     Objective:    Today's Vitals   04/07/23 0944  Weight: 100 lb (45.4 kg)  Height: 5\' 1"  (1.549 m)   Body mass index is 18.89 kg/m.     04/07/2023   10:03 AM 04/03/2022   10:43 AM 12/19/2020    1:38 PM 12/07/2019    3:38 PM 08/16/2018    9:19 AM 08/04/2017    1:14 PM  Advanced Directives  Does Patient Have a Medical Advance Directive? Yes No Yes Yes Yes Yes  Type of Advance Directive Living will;Healthcare Power of [employer] Power of [place];Living will Healthcare Power of [place];Living will Healthcare Power of [place];Living will Healthcare Power of [place];Living will  Does patient want to make changes to medical advance directive? No - Patient declined       Copy of Healthcare Power of Attorney in Chart? No - copy requested  Yes - validated most recent copy scanned in chart (See row information) Yes - validated most recent copy scanned in chart (See row information) Yes - validated most recent copy scanned in chart (See row information) No - copy requested  Would patient like information on creating a medical advance directive?  No - Patient declined        Current Medications (verified) Outpatient Encounter Medications as of 04/07/2023  Medication Sig   acetaminophen (TYLENOL) 500 MG tablet Take 500 mg every 6 (six) hours as needed by mouth.   apixaban (ELIQUIS) 2.5 MG  TABS tablet Take 2.5 mg by mouth 2 (two) times daily.    atorvastatin (LIPITOR) 20 MG tablet Take 20 mg by mouth daily at 6 PM.    BISMUTH SUBSALICYLATE PO as needed.    Multiple Vitamins-Minerals (EYE VITAMINS PO) Take by mouth 2 (two) times daily.   Multiple Vitamins-Minerals (PRESERVISION AREDS 2 PO) Take 1 capsule by mouth 2 (two) times daily.   ondansetron (ZOFRAN) 4 MG tablet Take 1 tablet (4 mg total) by mouth every 8 (eight) hours as needed for nausea or vomiting.   tizanidine (ZANAFLEX) 2 MG capsule Take 1 capsule (2 mg total) by mouth 3 (three) times daily.   No facility-administered encounter medications on file as of 04/07/2023.    Allergies (verified) Sulfa antibiotics and Sulfasalazine   History: Past Medical History:  Diagnosis Date   Cancer (HCC)    skin cancer- removed 10/06/16   Hyperlipidemia    Stroke [facility])    Past Surgical History:  Procedure Laterality Date   APPENDECTOMY  1965   BREAST BIOPSY Right 2002   BREAST LUMPECTOMY Right 2002   HERNIA REPAIR  1974   TUBAL LIGATION  1965   Family History  Problem Relation Age of Onset   Melanoma Brother    Stroke Brother    Heart attack Brother    Heart disease Sister    Social History   Socioeconomic History   Marital status: Widowed    Spouse name:  Not on file   Number of children: 5   Years of education: Not on file   Highest education level: Some college, no degree  Occupational History   Occupation: retired  Tobacco Use   Smoking status: Never   Smokeless tobacco: Never  Vaping Use   Vaping Use: Never used  Substance and Sexual Activity   Alcohol use: No    Alcohol/week: 0.0 standard drinks of alcohol   Drug use: No   Sexual activity: Never  Other Topics Concern   Not on file  Social History Narrative   Not on file   Social Determinants of Health   Financial Resource Strain: Low Risk  (04/07/2023)   Overall Financial Resource Strain (CARDIA)    Difficulty of Paying Living Expenses: Not  hard at all  Food Insecurity: No Food Insecurity (04/07/2023)   Hunger Vital Sign    Worried About Running Out of Food in the Last Year: Never true    Ran Out of Food in the Last Year: Never true  Transportation Needs: No Transportation Needs (04/07/2023)   PRAPARE - Administrator, Civil Service (Medical): No    Lack of Transportation (Non-Medical): No  Physical Activity: Inactive (04/07/2023)   Exercise Vital Sign    Days of Exercise per Week: 0 days    Minutes of Exercise per Session: 0 min  Stress: No Stress Concern Present (04/07/2023)   [employer] of Occupational Health - Occupational Stress Questionnaire    Feeling of Stress : Only a little  Social Connections: Moderately Isolated (04/07/2023)   Social Connection and Isolation Panel [NHANES]    Frequency of Communication with Friends and Family: More than three times a week    Frequency of Social Gatherings with Friends and Family: More than three times a week    Attends Religious Services: More than 4 times per year    Active Member of [employer] or Organizations: No    Attends Banker Meetings: Never    Marital Status: Widowed    Tobacco Counseling Counseling given: Not Answered   Clinical Intake:  Pre-visit preparation completed: Yes  Pain : No/denies pain     BMI - recorded: 19.89 Nutritional Status: BMI of 19-24  Normal Diabetes: No  How often do you need to have someone help you when you read instructions, pamphlets, or other written materials from your doctor or pharmacy?: 5 - Always What is the last grade level you completed in school?: HS graduate  Interpreter Needed?: No  Information entered by :: Dallas Torok, CMA   Activities of Daily Living    04/07/2023    9:53 AM 06/17/2022    9:44 AM  In your present state of health, do you have any difficulty performing the following activities:  Hearing? 1 1  Comment uses hearing aids   Vision? 1 1  Comment legally blind   Difficulty  concentrating or making decisions? 1 1  Walking or climbing stairs? 0 0  Dressing or bathing? 0 0  Doing errands, shopping? 1 1  Preparing Food and eating ? N   Using the Toilet? N   In the past six months, have you accidently leaked urine? N   Do you have problems with loss of bowel control? N   Managing your Medications? Y   Managing your Finances? N   Housekeeping or managing your Housekeeping? N     Patient Care Team: Alfredia Ferguson, Cordelia Poche as PCP - General (Physician Assistant) Sallee Lange, MD  as Consulting Physician (Ophthalmology) Lourdes Sledge, MD as Referring Physician (Internal Medicine) Luvenia Heller, MD (Inactive) as Consulting Physician (Neurology) Mittie Bodo, MD as Referring Physician (Cardiology) Throat, [facility]  Indicate any recent Medical Services you may have received from other than [facility] providers in the past year (date may be approximate).     Assessment:   This is a routine wellness examination for [facility].  Hearing/Vision screen Hearing Screening - Comments:: Wears hearing aids  Dietary issues and exercise activities discussed:     Goals Addressed             This Visit's Progress    DIET - EAT MORE FRUITS AND VEGETABLES   On track    Increase water intake   On track    Recommend increasing water intake to 6-8 8 oz glasses a day.         Depression Screen    04/07/2023   10:07 AM 06/17/2022    9:44 AM 04/03/2022   10:40 AM 12/19/2020    1:35 PM 12/19/2020    1:34 PM 12/07/2019    3:35 PM 08/16/2018    9:27 AM  PHQ 2/9 Scores  PHQ - 2 Score 2 5 1 1  0 0 0  PHQ- 9 Score 6 10 1     0    Fall Risk    04/07/2023    9:56 AM 06/17/2022    9:44 AM 04/03/2022   10:44 AM 12/19/2020    1:39 PM 12/07/2019    3:38 PM  Fall Risk   Falls in the past year? 1 1 1  0 0  Number falls in past yr: 0 0 0 0 0  Injury with Fall? 1 0 0 0 0  Comment broke her hip in Nov. doing well now      Risk for fall due to : Other (Comment)   History of fall(s)    Risk for fall due to: Comment not sitting down on time      Follow up Falls prevention discussed Falls evaluation completed Falls evaluation completed;Falls prevention discussed      MEDICARE RISK AT HOME:  Medicare Risk at Home - 04/07/23 1003     Any stairs in or around the home? Yes    If so, are there any without handrails? Yes    Home free of loose throw rugs in walkways, pet beds, electrical cords, etc? Yes    Adequate lighting in your home to reduce risk of falls? Yes    Life alert? Yes    Use of a cane, walker or w/c? Yes    Grab bars in the bathroom? Yes    Shower chair or bench in shower? Yes    Elevated toilet seat or a handicapped toilet? No             TIMED UP AND GO:  Was the test performed?  No    Cognitive Function:        04/07/2023   10:05 AM 04/03/2022   10:46 AM  6CIT Screen  What Year? 0 points 0 points  What month? 0 points 0 points  What time? 0 points 0 points  Count back from 20 0 points 0 points  Months in reverse 0 points 0 points  Repeat phrase 0 points 2 points  Total Score 0 points 2 points    Immunizations Immunization History  Administered Date(s) Administered   Fluad Quad(high Dose 65+) 10/17/2020, 06/17/2022   Influenza, High Dose  Seasonal PF 07/29/2016, 07/29/2016, 08/04/2017, 08/04/2017, 08/16/2018, 08/16/2018   PFIZER(Purple Top)SARS-COV-2 Vaccination 10/15/2019, 11/07/2019, 07/04/2020    TDAP status: Due, Education has been provided regarding the importance of this vaccine. Advised may receive this vaccine at local pharmacy or Health Dept. Aware to provide a copy of the vaccination record if obtained from local pharmacy or Health Dept. Verbalized acceptance and understanding.  Flu Vaccine status: Up to date  Pneumococcal vaccine status: Due, Education has been provided regarding the importance of this vaccine. Advised may receive this vaccine at local pharmacy or Health Dept. Aware to provide a copy of  the vaccination record if obtained from local pharmacy or Health Dept. Verbalized acceptance and understanding.  Covid-19 vaccine status: Completed vaccines  Qualifies for Shingles Vaccine? Yes   Zostavax completed Yes   Shingrix Completed?: Yes  Screening Tests Health Maintenance  Topic Date Due   DTaP/Tdap/Td (1 - Tdap) Never done   Zoster Vaccines- Shingrix (1 of 2) Never done   Pneumonia Vaccine 60+ Years old (1 of 1 - PCV) Never done   DEXA SCAN  Never done   COVID-19 Vaccine (4 - 2023-24 season) 05/30/2022   INFLUENZA VACCINE  04/30/2023   Medicare Annual Wellness (AWV)  04/06/2024   HPV VACCINES  Aged Out    Health Maintenance  Health Maintenance Due  Topic Date Due   DTaP/Tdap/Td (1 - Tdap) Never done   Zoster Vaccines- Shingrix (1 of 2) Never done   Pneumonia Vaccine 83+ Years old (1 of 1 - PCV) Never done   DEXA SCAN  Never done   COVID-19 Vaccine (4 - 2023-24 season) 05/30/2022    Colorectal cancer screening: No longer required.   Mammogram status: No longer required due to age.  Bone Density status: n/a  Lung Cancer Screening: (Low Dose CT Chest recommended if Age 23-80 years, 20 pack-year currently smoking OR have quit w/in 15years.) does not qualify.   Lung Cancer Screening Referral: n/a  Additional Screening:  Hepatitis C Screening: does not  Vision Screening: Recommended annual ophthalmology exams for early detection of glaucoma and other disorders of the eye. Is the patient up to date with their annual eye exam?  Yes  Who is the provider or what is the name of the office in which the patient attends annual eye exams? [facility] If pt is not established with a provider, would they like to be referred to a provider to establish care? No .   Dental Screening: Recommended annual dental exams for proper oral hygiene  Community Resource Referral / Chronic Care Management: CRR required this visit?  No   CCM required this visit?  No      Plan:     I have personally reviewed and noted the following in the patient's chart:   Medical and social history Use of alcohol, tobacco or illicit drugs  Current medications and supplements including opioid prescriptions. Patient is not currently taking opioid prescriptions. Functional ability and status Nutritional status Physical activity Advanced directives List of other physicians Hospitalizations, surgeries, and ER visits in previous 12 months Vitals Screenings to include cognitive, depression, and falls Referrals and appointments  In addition, I have reviewed and discussed with patient certain preventive protocols, quality metrics, and best practice recommendations. A written personalized care plan for preventive services as well as general preventive health recommendations were provided to patient.     Melanee Spry, CMA   04/07/2023   After Visit Summary: (MyChart) Due to this being a  telephonic visit, the after visit summary with patients personalized plan was offered to patient via MyChart   Nurse Notes: none

## 2023-04-07 NOTE — Patient Instructions (Signed)
Margaret Hall , Thank you for taking time to come for your Medicare Wellness Visit. I appreciate your ongoing commitment to your health goals. Please review the following plan we discussed and let me know if I can assist you in the future.   These are the goals we discussed:  Goals      DIET - EAT MORE FRUITS AND VEGETABLES     Increase water intake     Recommend increasing water intake to 6-8 8 oz glasses a day.           This is a list of the screening recommended for you and due dates:  Health Maintenance  Topic Date Due   DTaP/Tdap/Td vaccine (1 - Tdap) Never done   Zoster (Shingles) Vaccine (1 of 2) Never done   Pneumonia Vaccine (1 of 1 - PCV) Never done   DEXA scan (bone density measurement)  Never done   COVID-19 Vaccine (4 - 2023-24 season) 05/30/2022   Flu Shot  04/30/2023   Medicare Annual Wellness Visit  04/06/2024   HPV Vaccine  Aged Out     Health Maintenance After Age 93 After age 33, you are at a higher risk for certain long-term diseases and infections as well as injuries from falls. Falls are a major cause of broken bones and head injuries in people who are older than age 14. Getting regular preventive care can help to keep you healthy and well. Preventive care includes getting regular testing and making lifestyle changes as recommended by your health care provider. Talk with your health care provider about: Which screenings and tests you should have. A screening is a test that checks for a disease when you have no symptoms. A diet and exercise plan that is right for you. What should I know about screenings and tests to prevent falls? Screening and testing are the best ways to find a health problem early. Early diagnosis and treatment give you the best chance of managing medical conditions that are common after age 69. Certain conditions and lifestyle choices may make you more likely to have a fall. Your health care provider may recommend: Regular vision checks.  Poor vision and conditions such as cataracts can make you more likely to have a fall. If you wear glasses, make sure to get your prescription updated if your vision changes. Medicine review. Work with your health care provider to regularly review all of the medicines you are taking, including over-the-counter medicines. Ask your health care provider about any side effects that may make you more likely to have a fall. Tell your health care provider if any medicines that you take make you feel dizzy or sleepy. Strength and balance checks. Your health care provider may recommend certain tests to check your strength and balance while standing, walking, or changing positions. Foot health exam. Foot pain and numbness, as well as not wearing proper footwear, can make you more likely to have a fall. Screenings, including: Osteoporosis screening. Osteoporosis is a condition that causes the bones to get weaker and break more easily. Blood pressure screening. Blood pressure changes and medicines to control blood pressure can make you feel dizzy. Depression screening. You may be more likely to have a fall if you have a fear of falling, feel depressed, or feel unable to do activities that you used to do. Alcohol use screening. Using too much alcohol can affect your balance and may make you more likely to have a fall. Follow these instructions at home: Lifestyle  Do not drink alcohol if: Your health care provider tells you not to drink. If you drink alcohol: Limit how much you have to: 0-1 drink a day for women. 0-2 drinks a day for men. Know how much alcohol is in your drink. In the [place] one drink equals one 12 oz bottle of beer (355 mL), one 5 oz glass of wine (148 mL), or one 1 oz glass of hard liquor (44 mL). Do not use any products that contain nicotine or tobacco. These products include cigarettes, chewing tobacco, and vaping devices, such as e-cigarettes. If you need help quitting, ask your health care  provider. Activity  Follow a regular exercise program to stay fit. This will help you maintain your balance. Ask your health care provider what types of exercise are appropriate for you. If you need a cane or walker, use it as recommended by your health care provider. Wear supportive shoes that have nonskid soles. Safety  Remove any tripping hazards, such as rugs, cords, and clutter. Install safety equipment such as grab bars in bathrooms and safety rails on stairs. Keep rooms and walkways well-lit. General instructions Talk with your health care provider about your risks for falling. Tell your health care provider if: You fall. Be sure to tell your health care provider about all falls, even ones that seem minor. You feel dizzy, tiredness (fatigue), or off-balance. Take over-the-counter and prescription medicines only as told by your health care provider. These include supplements. Eat a healthy diet and maintain a healthy weight. A healthy diet includes low-fat dairy products, low-fat (lean) meats, and fiber from whole grains, beans, and lots of fruits and vegetables. Stay current with your vaccines. Schedule regular health, dental, and eye exams. Summary Having a healthy lifestyle and getting preventive care can help to protect your health and wellness after age 9. Screening and testing are the best way to find a health problem early and help you avoid having a fall. Early diagnosis and treatment give you the best chance for managing medical conditions that are more common for people who are older than age 48. Falls are a major cause of broken bones and head injuries in people who are older than age 66. Take precautions to prevent a fall at home. Work with your health care provider to learn what changes you can make to improve your health and wellness and to prevent falls. This information is not intended to replace advice given to you by your health care provider. Make sure you discuss  any questions you have with your health care provider. Document Revised: 02/04/2021 Document Reviewed: 02/04/2021 Elsevier Patient Education  2024 [employer].

## 2024-07-18 ENCOUNTER — Other Ambulatory Visit: Payer: Self-pay | Admitting: Internal Medicine

## 2024-07-18 ENCOUNTER — Ambulatory Visit
Admission: RE | Admit: 2024-07-18 | Discharge: 2024-07-18 | Disposition: A | Discharge status: Home / Self Care | Source: Ambulatory Visit | Attending: Internal Medicine | Admitting: Internal Medicine

## 2024-07-18 DIAGNOSIS — R100 Acute abdomen: Secondary | ICD-10-CM | POA: Insufficient documentation

## 2024-07-18 DIAGNOSIS — R1011 Right upper quadrant pain: Secondary | ICD-10-CM | POA: Insufficient documentation
